# Patient Record
Sex: Male | Born: 1935 | Race: White | Hispanic: No | Marital: Married
Health system: Southern US, Community
[De-identification: ages and names within clinical notes are randomized; demographics above are authoritative.]

## PROBLEM LIST (undated history)

## (undated) DIAGNOSIS — M109 Gout, unspecified: Secondary | ICD-10-CM

## (undated) DIAGNOSIS — I639 Cerebral infarction, unspecified: Secondary | ICD-10-CM

---

## 2006-07-24 ENCOUNTER — Ambulatory Visit: Payer: Self-pay | Admitting: Gastroenterology

## 2009-09-15 ENCOUNTER — Ambulatory Visit: Payer: Self-pay | Admitting: Gastroenterology

## 2012-10-16 ENCOUNTER — Ambulatory Visit: Payer: Self-pay | Admitting: Gastroenterology

## 2012-10-17 LAB — PATHOLOGY REPORT

## 2013-12-11 DIAGNOSIS — E876 Hypokalemia: Secondary | ICD-10-CM | POA: Insufficient documentation

## 2013-12-11 DIAGNOSIS — I69351 Hemiplegia and hemiparesis following cerebral infarction affecting right dominant side: Secondary | ICD-10-CM | POA: Insufficient documentation

## 2013-12-11 DIAGNOSIS — I69322 Dysarthria following cerebral infarction: Secondary | ICD-10-CM | POA: Insufficient documentation

## 2014-01-05 DIAGNOSIS — I10 Essential (primary) hypertension: Secondary | ICD-10-CM | POA: Insufficient documentation

## 2014-04-08 DIAGNOSIS — K2270 Barrett's esophagus without dysplasia: Secondary | ICD-10-CM | POA: Insufficient documentation

## 2014-04-08 DIAGNOSIS — G629 Polyneuropathy, unspecified: Secondary | ICD-10-CM | POA: Insufficient documentation

## 2014-04-08 DIAGNOSIS — H6123 Impacted cerumen, bilateral: Secondary | ICD-10-CM | POA: Insufficient documentation

## 2014-05-27 DIAGNOSIS — N289 Disorder of kidney and ureter, unspecified: Secondary | ICD-10-CM | POA: Insufficient documentation

## 2014-05-27 DIAGNOSIS — R32 Unspecified urinary incontinence: Secondary | ICD-10-CM | POA: Insufficient documentation

## 2014-08-31 DIAGNOSIS — E782 Mixed hyperlipidemia: Secondary | ICD-10-CM | POA: Insufficient documentation

## 2014-08-31 DIAGNOSIS — R400 Somnolence: Secondary | ICD-10-CM | POA: Insufficient documentation

## 2014-12-02 DIAGNOSIS — R600 Localized edema: Secondary | ICD-10-CM | POA: Insufficient documentation

## 2014-12-02 DIAGNOSIS — R252 Cramp and spasm: Secondary | ICD-10-CM | POA: Insufficient documentation

## 2015-02-25 DIAGNOSIS — G8110 Spastic hemiplegia affecting unspecified side: Secondary | ICD-10-CM | POA: Insufficient documentation

## 2015-03-31 DIAGNOSIS — R0609 Other forms of dyspnea: Secondary | ICD-10-CM | POA: Insufficient documentation

## 2015-03-31 DIAGNOSIS — R5383 Other fatigue: Secondary | ICD-10-CM | POA: Insufficient documentation

## 2015-11-03 DIAGNOSIS — L8991 Pressure ulcer of unspecified site, stage 1: Secondary | ICD-10-CM | POA: Insufficient documentation

## 2016-01-07 DIAGNOSIS — R350 Frequency of micturition: Secondary | ICD-10-CM | POA: Insufficient documentation

## 2016-03-19 DIAGNOSIS — S72001A Fracture of unspecified part of neck of right femur, initial encounter for closed fracture: Secondary | ICD-10-CM | POA: Insufficient documentation

## 2019-01-16 ENCOUNTER — Other Ambulatory Visit: Payer: Self-pay

## 2019-01-16 ENCOUNTER — Non-Acute Institutional Stay: Payer: Medicare Other | Admitting: Primary Care

## 2019-01-16 DIAGNOSIS — Z515 Encounter for palliative care: Secondary | ICD-10-CM

## 2019-01-16 NOTE — Progress Notes (Signed)
Therap[facility], [facility] Consult Note Telephone: [phone]  Fax: [phone]  TELEHEALTH VISIT STATEMENT Due to the COVID-19 crisis, this visit was done via telemedicine from my office. It was initiated and consented to by this patient and/or family.  PATIENT NAME: Eugene [name] DOB: 1929-07-23 MRN: [id number]  PRIMARY CARE PROVIDER:   Drue FlirtHenry-Ksean [name], Carol, MD [address] [place] [address]  REFERRING PROVIDER: Drue FlirtHenry-Lestine [name], Carol, MD [address] [place] [address]  RESPONSIBLE PARTY:   Extended Emergency Contact Information Primary Emergency Contact: Eugene [name] Address: [address]          [place], [place] [address] Home Phone: [phone] Mobile Phone: [phone] Relation: Daughter Secondary Emergency Contact: Eugene [name] Mobile Phone: [phone] Relation: Daughter  Palliative Care was asked to follow this patient by consultation request of Eugene FlirtHenry-Raymir Frommelt, Carol, MD. This is the initial visit.  ASSESSMENT AND RECOMMENDATIONS:   1. Goals of Care: Maximize quality of life and symptom management.  2. Symptom Management:   Nutrition: Recommend med pass as family states he will take this readily. Also magic cup ice cream on his tray. Has stopped eating as much,  lost 10 lbs since April. Has had loss of appetite due to not being able to eat  In congregated area per his daughter.  He is a picky eater per family and hates [place]. Also had all teeth pulled and dentures created. He did  not eat with them but they also got thrown away by accident. Likes med pass but will not take nutrition supplement. Also likes ice cream.  Depression: Revisit anti depressant reduction. Is on anti depressant but was reduced from 50 mg to 25 mg in July. May be due to poor hearing, isolation. Continue SSRI and increase stimulation. Has done some window visits. Depressive outlook persists according to daughter. Sensory deficits have contributed  to depression per his daughter. He has good hearing aides but he also depends on reading lips. He is using a white board as well. I suggested TV ears so he can hear the television.  Immobility due to CVA and limb deficits: Has self-care deficits due to CVA and fractures from 2017. He did not make much progress after second insult of fractures. R sided hemiparesis, but is not weight bearing now due to debility. R side dominant and uses left hand for feeding now.   Dementia:  Family has wondered if this is an issue due to some confusion of reality with dreams (thought he heard fox hunting) and some hallucinations (reported a cross between a bat and spider on the ceiling to his family). Assess for increasing dementia of a vascular nature due to insult of CVA.   3. Family /Caregiver/Community Supports: T/c to Epic contact of record which was his wife, who is unable to answer phone. T/c to PR Eugene MustacheJudy Cobb, we discussed patient case and she gave me some insights, we also discussed his goals of care. 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  4. Cognitive / Functional decline: Staff states decline in intake and interaction. He has loss of function from previous CVA and debility. Daughter states he can ambulate in building with wheel chair.  5. Advance Care Directive: Full code per SNF record, POA discussed he has a living will. Daughter states they have prepared answere for MOST form. I will mail to them for them to return and post at [facility]. I will also upload DNR and MOST in [facility].  6. Follow up Palliative Care Visit: Palliative care will continue to follow for goals of care clarification and symptom management. Return 2-4 weeks or prn.  I spent 45 minutes providing this consultation,  from 1700 to 1745. More than 50% of the time in this consultation was spent coordinating communication.   HISTORY OF PRESENT ILLNESS:  Eugene [name] is a 85 y.o. year old male with multiple medical problems including  CVA, old hip R fracture, hearing deficit, spastic right hemiparesis, depression. Palliative Care was asked to help address goals of care.   CODE STATUS: Daughter gave verbal DNR, Will complete MOST form and return.  PPS: 30% HOSPICE ELIGIBILITY/DIAGNOSIS: TBD  PAST MEDICAL HISTORY: CVA, old hip R fracture, hearing deficit, spastic right hemiparesis, depression  SOCIAL HX:  Social History   Tobacco Use  . Smoking status: Not on file  Substance Use Topics  . Alcohol use: Not on file    ALLERGIES: Not on File   PERTINENT MEDICATIONS:  No outpatient encounter medications on file as of 83/11/2018.   No facility-administered encounter medications on file as of 83/11/2018.     PHYSICAL EXAM/ROS:   Current and past weights: 147 lb in April, now 137 lb. General: NAD, frail appearing, thin, very HOH, depends on lip reading. Cardiovascular: no chest pain reported, no edema,  Pulmonary: no cough, no increased SOB Abdomen: Appetite declining, eats 50%, endorses occ.  constipation, incontinent of bowel. Will take nutrition as med pass but won't take it as meal supplement.  GU: denies dysuria, incontinent of urine MSK: Right hemiparesis, loss of L LE strength and w/c bound Skin: no rashes or wounds reported Neurological: Weakness, R hemiparesis, mild cognitive impairment, reports of possible hallucinations  Cyndia Skeeters DNP AGPCNP-BC

## 2019-01-23 ENCOUNTER — Other Ambulatory Visit: Payer: Self-pay

## 2019-01-23 ENCOUNTER — Non-Acute Institutional Stay: Payer: Medicare Other | Admitting: Primary Care

## 2019-01-23 DIAGNOSIS — Z515 Encounter for palliative care: Secondary | ICD-10-CM

## 2019-01-23 NOTE — Progress Notes (Signed)
Therap[facility], [facility] Consult Note Telephone: [phone]  Fax: [phone]   PATIENT NAME: Eugene [name] Nolasco DOB: 18-Feb-1930 MRN: [id number]  PRIMARY CARE PROVIDER:   Drue FlirtHenry-Kionte Baumgardner, Carol, MD, [address] [place] [address] [phone]  REFERRING PROVIDER:  Drue FlirtHenry-Woodie Degraffenreid, Carol, MD [address] [place],  [place] [address] [phone]  RESPONSIBLE PARTY:   Extended Emergency Contact Information Primary Emergency Contact: Sherrie Mustacheobb, Judy Address: [address]          [place], [place] [address] Home Phone: [phone] Mobile Phone: [phone] Relation: Daughter Secondary Emergency Contact: Epifania GoreWoody, Carman Mobile Phone: [phone] Relation: Daughter   ASSESSMENT AND RECOMMENDATIONS:   1. Advance Care Planning/Goals of Care: Goals include to maximize quality of life and symptom management. I informed SW of my conversation with daughter last week giving verbal DNR. She'd also discussed MOST form. SW B Ammie FerrierDunkley will mail a MOST form to [place] to complete and return to [facility]. I will also scan that into Epic when returned. I visited today following initial visit last week in order to meet patient in person and continue needs assessment.  2. Symptom Management:   Nutrition: Poor intake. Add Medpass to regimen. Staff states they no longer offer nutritional supplements. I would encourage med pass if possible.   Dementia: Recommend ASAP ST assessment for communication needs. Recommend increase of SSRI back to effective level as demential and depression can overlap. Some reports of not understanding could be sensory related. He has periods that may seem like confusion but it could be due to his deafness.   3. Family /Caregiver/Community Supports: Has wife who is also under care, daughters  in area to care for parents. Current resident of LTC.  4. Cognitive / Functional decline: Alert,interactive. Is mobile in wheel chair, is up in w/c today.Very  HOH and cannot communicate effectively.   5. Follow up Palliative Care Visit: Palliative care will continue to follow for goals of care clarification and symptom management. Return 4 weeks or prn.  I spent 15 minutes providing this consultation,  from 1045 to 1100. More than 50% of the time in this consultation was spent coordinating communication.   HISTORY OF PRESENT ILLNESS:  Eugene [name] is a 85 y.o. year old male with multiple medical problems including CVA, old hip [name] fracture, hearing deficit, spastic right hemiparesis, depression. Palliative Care was asked to follow this patient by consultation request of Drue FlirtHenry-Sandhya Denherder, Carol, MD to help address advance care planning and goals of care. This is a follow up visit.  CODE STATUS: DNR  PPS: 30% HOSPICE ELIGIBILITY/DIAGNOSIS: TBD  PAST MEDICAL HISTORY: No past medical history on file.  SOCIAL HX:  Social History   Tobacco Use  . Smoking status: Not on file  Substance Use Topics  . Alcohol use: Not on file    ALLERGIES: Not on File   PERTINENT MEDICATIONS:  Outpatient Encounter Medications as of 01/23/2019  Medication Sig  . allopurinol (ZYLOPRIM) 300 MG tablet Take 150 mg by mouth daily.  Marland Kitchen. aspirin EC 81 MG tablet Take 81 mg by mouth daily.  . cholecalciferol (VITAMIN D3) 10 MCG (400 UNIT) TABS tablet Take 400 Units by mouth daily.  Marland Kitchen. HYDROcodone-acetaminophen (NORCO/VICODIN) 5-325 MG tablet Take 1 tablet by mouth every 6 (six) hours as needed for moderate pain.  . Melatonin 5 MG TABS Take 1 tablet by mouth at bedtime.  . methimazole (TAPAZOLE) 5 MG tablet Take 2.5 mg by mouth daily.  Marland Kitchen. omeprazole (  PRILOSEC) 20 MG capsule Take 20 mg by mouth daily.  . polyethylene glycol (MIRALAX / GLYCOLAX) 17 g packet Take 17 g by mouth daily.  . polyethylene glycol powder (GLYCOLAX/MIRALAX) 17 GM/SCOOP powder Take 1 Container by mouth once.  . sertraline (ZOLOFT) 25 MG tablet Take 25 mg by mouth daily.  . tamsulosin (FLOMAX) 0.4 MG CAPS  capsule Take 0.8 mg by mouth.   No facility-administered encounter medications on file as of 01/23/2019.     PHYSICAL EXAM / ROS:   Current and past weights: current 137 lb, 10 lb loss since April. General: NAD, frail appearing, thin HOH, depends on lip reading and context.  Cardiovascular: no chest pain reported, no edema,  Pulmonary: no cough, no increased SOB Abdomen: appetite fair (50% or less), endorses constipation, incontinent of bowel GU: denies dysuria, incontinent of urine MSK:  Right hemiparesis, loss of L LE strength and w/c bound Skin: no rashes or wounds reported Neurological: Weakness, very HOH, some mild cognitive impairment, [name] hemiparesis s/p CVA  Cyndia Skeeters DNP AGPCNP-BC  COVID-19 PATIENT SCREENING TOOL  Person answering questions: _____________staff______ _____   1.  Is the patient or any family member in the home showing any signs or symptoms regarding respiratory infection?               Person with Symptom- _________________na__________  a. Fever                                                                          Yes___ No___          ___________________  b. Shortness of breath                                                    Yes___ No___          ___________________ c. Cough/congestion                                       Yes___  No___         ___________________ d. Body aches/pains                                                         Yes___ No___        ____________________ e. Gastrointestinal symptoms (diarrhea, nausea)           Yes___ No___        ____________________  2. Within the past 14 days, has anyone living in the home had any contact with someone with or under investigation for COVID-19?    Yes___ No_x_   Person __________________

## 2019-02-20 ENCOUNTER — Non-Acute Institutional Stay: Payer: Medicare Other | Admitting: Primary Care

## 2019-02-20 ENCOUNTER — Other Ambulatory Visit: Payer: Self-pay

## 2019-02-20 DIAGNOSIS — Z515 Encounter for palliative care: Secondary | ICD-10-CM

## 2019-02-20 NOTE — Progress Notes (Signed)
Therapist, [facility] Consult Note Telephone: [phone]  Fax: [phone]  TELEHEALTH VISIT STATEMENT Due to the COVID-19 crisis, this visit was done via telemedicine from my office. It was initiated and consented to by this patient and/or family.  PATIENT NAME: Eugene Holsterhillip R Geibel DOB: 01/06/1930 MRN: [id number]  PRIMARY CARE PROVIDER:   Drue FlirtHenry-Alzena Gerber, Carol, MD, [address] [place] [address] [phone]  REFERRING PROVIDER:  Drue FlirtHenry-Noell Shular, Carol, MD [address] [place],  [place] [address] [phone]  RESPONSIBLE PARTY:   Extended Emergency Contact Information Primary Emergency Contact: Sherrie Mustacheobb, Judy Address: [address]          [place], [place] [address] Home Phone: [phone] Mobile Phone: [phone] Relation: Daughter Secondary Emergency Contact: Epifania GoreWoody, Carman Mobile Phone: [phone] Relation: Daughter   ASSESSMENT AND RECOMMENDATIONS:   1. Advance Care Planning/Goals of Care: Goals include to maximize quality of life and symptom management.DNR on record. Was reported to have MOST but I cannot locate it. I will reach out to family to create MOST.  2. Symptom Management:   Dysphagia: ST is working with patient on communications, dysphagia. On nectar liquids.  Nutrition: Recommend supplements, eg med pass. Not on med list. Weights still vary, could benefit from supplements as he is still under recommended weight.   Pain: Denies pain, appears comfortable. States he sleeps well.   3. Family /Caregiver/Community Supports: Has wife who is in need of ATC care, daughters  in area to care for parents. Current resident of LTC.  4. Cognitive / Functional decline:  Mild cognitive decline but also has extreme sensory deprivation, cannot hear must communicate by answering questions on written white board. We looked at the wedding picture of his daughter some time ago.  5. Follow up Palliative Care Visit: Palliative care will  continue to follow for goals of care clarification and symptom management. Return 4 weeks or prn.  I spent 25 minutes providing this consultation,  from 1030 to 1055. More than 50% of the time in this consultation was spent coordinating communication.   HISTORY OF PRESENT ILLNESS:  Eugene [name] is a 85 y.o. year old male with multiple medical problems including CVA, old hip R fracture, hearing deficit, spastic right hemiparesis, depression. Palliative Care was asked to follow this patient by consultation request of Drue FlirtHenry-Cattaleya Wien, Carol, MD to help address advance care planning and goals of care. This is a follow up visit.  CODE STATUS: DNR, no MOST on chart  PPS: 40% HOSPICE ELIGIBILITY/DIAGNOSIS: TBD  PAST MEDICAL HISTORY: CVA, old hip R fracture, hearing deficit, spastic right hemiparesis, depression. SOCIAL HX:  Social History   Tobacco Use  . Smoking status: Not on file  Substance Use Topics  . Alcohol use: Not on file    ALLERGIES: Not on File   PERTINENT MEDICATIONS:  Outpatient Encounter Medications as of 02/20/2019  Medication Sig  . allopurinol (ZYLOPRIM) 300 MG tablet Take 150 mg by mouth daily.  Marland Kitchen. aspirin EC 81 MG tablet Take 81 mg by mouth daily.  . cholecalciferol (VITAMIN D3) 10 MCG (400 UNIT) TABS tablet Take 400 Units by mouth daily.  . Melatonin 5 MG TABS Take 1 tablet by mouth at bedtime.  . methimazole (TAPAZOLE) 5 MG tablet Take 2.5 mg by mouth daily.  Marland Kitchen. omeprazole (PRILOSEC) 20 MG capsule Take 20 mg by mouth daily.  . polyethylene glycol (MIRALAX / GLYCOLAX) 17 g packet Take 17 g by mouth daily.  . sertraline (ZOLOFT) 25  MG tablet Take 25 mg by mouth daily.  . tamsulosin (FLOMAX) 0.4 MG CAPS capsule Take 0.8 mg by mouth.  Marland Kitchen HYDROcodone-acetaminophen (NORCO/VICODIN) 5-325 MG tablet Take 1 tablet by mouth every 6 (six) hours as needed for moderate pain.  . polyethylene glycol powder (GLYCOLAX/MIRALAX) 17 GM/SCOOP powder Take 1 Container by mouth once.   No  facility-administered encounter medications on file as of 02/20/2019.     PHYSICAL EXAM / ROS:   Current and past weights: 140 lbs, has gained 9 lbs since 7/20. Need to verify some of these readings as noted 10 lb loss since April. General: NAD, frail appearing, thin Cardiovascular: no chest pain reported, no edema noted Pulmonary: no cough, no increased SOB, room air Abdomen: appetite fair, eats 50%, denies constipation, incontinent of bowel GU: denies dysuria, incontinent of urine MSK:  hemiparesis, loss of L LE strength and w/c bound in w/c this am Skin: no rashes or wounds reported Neurological: Weakness, mild impairment, Very HOH, uses communication board, states good sleep, no pain  Cyndia Skeeters DNP AGPCNP-BC  COVID-19 PATIENT SCREENING TOOL  Person answering questions: ______________Staff Julia_____ _____   1.  Is the patient or any family member in the home showing any signs or symptoms regarding respiratory infection?               Person with Symptom- _________________nA__________  a. Fever                                                                          Yes___ No___          ___________________  b. Shortness of breath                                                    Yes___ No___          ___________________ c. Cough/congestion                                       Yes___  No___         ___________________ d. Body aches/pains                                                         Yes___ No___        ____________________ e. Gastrointestinal symptoms (diarrhea, nausea)           Yes___ No___        ____________________  2. Within the past 14 days, has anyone living in the home had any contact with someone with or under investigation for COVID-19?    Yes___ No_x_   Person __________________

## 2019-02-21 ENCOUNTER — Telehealth: Payer: Self-pay | Admitting: Primary Care

## 2019-02-21 NOTE — Telephone Encounter (Signed)
T/c to daughter Bethena Roys, no answer, message left to return call.

## 2019-03-20 ENCOUNTER — Non-Acute Institutional Stay: Payer: Medicare Other | Admitting: Primary Care

## 2019-03-21 ENCOUNTER — Other Ambulatory Visit: Payer: Self-pay

## 2019-03-21 ENCOUNTER — Non-Acute Institutional Stay: Payer: Medicare Other | Admitting: Primary Care

## 2019-03-21 DIAGNOSIS — Z515 Encounter for palliative care: Secondary | ICD-10-CM

## 2019-03-21 NOTE — Progress Notes (Signed)
[facility] Pall[facility] Consult Note Telephone: [phone]  Fax: [phone]  TELEHEALTH VISIT STATEMENT Due to the COVID-19 crisis, this visit was done via telemedicine from my office. It was initiated and consented to by this patient and/or family.  PATIENT NAME: Eugene [name] [place] [address] [phone] (home)  DOB: 03/06/1930 MRN: [id number]  PRIMARY CARE PROVIDER:   Marisa Hua, MD, [place] [address] [phone]  REFERRING PROVIDER:  Marisa Hua, MD [address] [place],  [place] [address] [phone]  RESPONSIBLE PARTY:   Extended Emergency Contact Information Primary Emergency Contact: Darnelle Maffucci Address: [address]          [place], [place] [address] Home Phone: [phone] Mobile Phone: [phone] Relation: Daughter Secondary Emergency Contact: Alona Bene Mobile Phone: [phone] Relation: Daughter   ASSESSMENT AND RECOMMENDATIONS:   1. Advance Care Planning/Goals of Care: Goals include to maximize quality of life and symptom management. DNR on file at SNF. Will mail MOST to family for upload to [facility].  2. Symptom Management:    Dysphagia: ST has finished working with patient and cleared for thin liquids. Was on nectar thick a month ago.  Nutrition: Eating 50% but BMI is WNL. Recommend supplement if wt continues to decline, although it appears variable over the past months.  Depression: Staff reports patient is doing well and has a fair mood. Recommend review of sertraline to 50 mg per guidelines to address depression.  3. Family /Caregiver/Community Supports: Family in area,visit frequently at window.  4. Cognitive / Functional decline: Mild decline, exacerbated by loss of hearing. Able to roll in w/c,iteracts with family at window visits. Staff assists with hearing aids, and uses white board for communication.  5. Follow up Palliative Care  Visit: Palliative care will continue to follow for goals of care clarification and symptom management. Return 4-6 weeks or prn.  I spent 25 minutes providing this consultation,  from 1000 to 1025. More than 50% of the time in this consultation was spent coordinating communication.   HISTORY OF PRESENT ILLNESS:  Eugene [name] is a 85 y.o. year old male with multiple medical problems including CVA, old hip R fracture, hearing deficit, spastic right hemiparesis, depression. Palliative Care was asked to follow this patient by consultation request of Marisa Hua, MD to help address advance care planning and goals of care. This is a follow up visit.  CODE STATUS: DNR  PPS: 30% HOSPICE ELIGIBILITY/DIAGNOSIS: TBD  PAST MEDICAL HISTORY: CVA, old hip R fracture, hearing deficit, spastic right hemiparesis, depression.  SOCIAL HX:  Social History   Tobacco Use  . Smoking status: Not on file  Substance Use Topics  . Alcohol use: Not on file    ALLERGIES: Not on File   PERTINENT MEDICATIONS:  Outpatient Encounter Medications as of 03/21/2019  Medication Sig  . allopurinol (ZYLOPRIM) 300 MG tablet Take 150 mg by mouth daily.  Marland Kitchen aspirin EC 81 MG tablet Take 81 mg by mouth daily.  . cholecalciferol (VITAMIN D3) 10 MCG (400 UNIT) TABS tablet Take 400 Units by mouth daily.  . Melatonin 5 MG TABS Take 1 tablet by mouth at bedtime.  . methimazole (TAPAZOLE) 5 MG tablet Take 2.5 mg by mouth daily.  Marland Kitchen omeprazole (PRILOSEC) 20 MG capsule Take 20 mg by mouth daily.  . polyethylene glycol (MIRALAX / GLYCOLAX) 17 g packet Take 17 g by mouth daily.  . sertraline (ZOLOFT) 25  MG tablet Take 25 mg by mouth daily.  . tamsulosin (FLOMAX) 0.4 MG CAPS capsule Take 0.8 mg by mouth.  Marland Kitchen HYDROcodone-acetaminophen (NORCO/VICODIN) 5-325 MG tablet Take 1 tablet by mouth every 6 (six) hours as needed for moderate pain.  . polyethylene glycol powder (GLYCOLAX/MIRALAX) 17 GM/SCOOP powder Take 1 Container by mouth  once.   No facility-administered encounter medications on file as of 03/21/2019.     PHYSICAL EXAM / ROS:  97.5-78-18 130/60  Average VS from flow sheet Current and past weights: 139.5 lbs, past weights 9/20=140 lbs, has gained 9 lbs since 7/20.  General: NAD, frail, thin Cardiovascular: no chest pain reported, no edema reported Pulmonary: no cough, no increased SOB Abdomen: appetite fair, 50%, denies  constipation, incontinent of bowel GU: denies dysuria, incontinent of urine MSK:  no joint deformities, non ambulatory, uses wheel chair, no gout flares reported Skin: no rashes or wounds reported Neurological: Weakness, Deafness, depression  Eliezer Lofts, NP

## 2019-04-25 ENCOUNTER — Non-Acute Institutional Stay: Payer: Medicare Other | Admitting: Primary Care

## 2019-04-25 ENCOUNTER — Other Ambulatory Visit: Payer: Self-pay

## 2019-04-25 DIAGNOSIS — Z515 Encounter for palliative care: Secondary | ICD-10-CM

## 2019-04-25 NOTE — Progress Notes (Signed)
Therapist, nutritional Palliative Care Consult Note Telephone: [phone]  Fax: [phone]  TELEHEALTH VISIT STATEMENT Due to the COVID-19 crisis, this visit was done via telemedicine from my office. It was initiated and consented to by this patient and/or family.  PATIENT NAME: Eugene [name] [address] [place] [address] [phone] (home)  DOB: [birth year] MRN: [id number]  PRIMARY CARE PROVIDER:   Drue Flirt, MD, [address] [place] [address] [phone]  REFERRING PROVIDER:  Drue Flirt, MD [address] [place],  [place] [address] [phone]  RESPONSIBLE PARTY:   Extended Emergency Contact Information Primary Emergency Contact: Sherrie Mustache Address: [address]          [place], [place] [address] Home Phone: [phone] Mobile Phone: [phone] Relation: Daughter Secondary Emergency Contact: Epifania Gore Mobile Phone: [phone] Relation: Daughter   ASSESSMENT AND RECOMMENDATIONS:   1. Advance Care Planning/Goals of Care: Goals include to maximize quality of life and symptom management.DNR on file, daughter Darel Hong is POA, called to discuss her questions/concerns. She has been very happy with being able to use her I phone to talk with her dad with blue tooth to his hearing aids.  2. Symptom Management:   Pain: Seems well controlled, patient is relaxed and smiling.  Constipation:  Recommend adding senna 8.6 mg daily or bid. Appears to have some  constipation with stools at 3 days, should have 3 soft stools a week without extreme effort to move bowels.   Nutrition: Continue to offer supplements, help with set up and feeding/cueing if needed. Weight is staying stable. Thin liquids, aspiration precautions.  Communication: Has dry erase markers and board for communication. Recommend a communication board pre -made with pictures for pointing out needs. Also recommend leaving out the white board so staff can  use it readily for communications.  3. Family /Caregiver/Community Supports: Family in area, daughter is POA and very involved in care. Patient is long term resident of facility.  4. Cognitive / Functional decline: Alert, oriented x 2-3. Stable with physical function at this time. Needs help with most adls, all iadls.   5. Follow up Palliative Care Visit: Palliative care will continue to follow for goals of care clarification and symptom management. Return 4-6 weeks or prn.  I spent 35 minutes providing this consultation,  from 1100 to 1135. More than 50% of the time in this consultation was spent coordinating communication.   HISTORY OF PRESENT ILLNESS:  MARCQUES [name] is a 85 y.o. year old male with multiple medical problems including CVA, old hip R fracture, hearing deficit, spastic right hemiparesis, depression. Palliative Care was asked to follow this patient by consultation request of Drue Flirt, MD to help address advance care planning and goals of care. This is a follow up visit.   CODE STATUS: DNR, family has not returned MOST as of yet.  PPS: 30% HOSPICE ELIGIBILITY/DIAGNOSIS: TBD  PAST MEDICAL HISTORY:CVA, old hip R fracture, hearing deficit, spastic right hemiparesis, depression.  SOCIAL HX:  Social History   Tobacco Use  . Smoking status: Not on file  Substance Use Topics  . Alcohol use: Not on file    ALLERGIES: Not on File   PERTINENT MEDICATIONS:  Outpatient Encounter Medications as of 04/25/2019  Medication Sig  . allopurinol (ZYLOPRIM) 300 MG tablet Take 150 mg by mouth daily.  Marland Kitchen aspirin EC 81 MG tablet Take 81 mg by mouth daily.  . cholecalciferol (VITAMIN D3) 10 MCG (400  UNIT) TABS tablet Take 400 Units by mouth daily.  Marland Kitchen HYDROcodone-acetaminophen (NORCO/VICODIN) 5-325 MG tablet Take 1 tablet by mouth every 6 (six) hours as needed for moderate pain.  . Melatonin 5 MG TABS Take 1 tablet by mouth at bedtime.  . methimazole (TAPAZOLE) 5 MG tablet Take  2.5 mg by mouth daily.  Marland Kitchen omeprazole (PRILOSEC) 20 MG capsule Take 20 mg by mouth daily.  . polyethylene glycol (MIRALAX / GLYCOLAX) 17 g packet Take 17 g by mouth daily.  . polyethylene glycol powder (GLYCOLAX/MIRALAX) 17 GM/SCOOP powder Take 1 Container by mouth once.  . sertraline (ZOLOFT) 25 MG tablet Take 25 mg by mouth daily.  . tamsulosin (FLOMAX) 0.4 MG CAPS capsule Take 0.8 mg by mouth.   No facility-administered encounter medications on file as of 04/25/2019.     PHYSICAL EXAM / ROS:   Current and past weights: 140 lbs., Oct 2020 =139.5 lbs, past weights 9/20=140 lbs, has gained 9 lbs since 7/20.  General: NAD, frail appearing, thin Cardiovascular: no chest pain reported, no edema, with compression stockings Pulmonary: no cough, no increased SOB, sat 97% Room air Abdomen: appetite fair to good,50%- 75%, endorses constipation, incontinent of bowel GU: denies dysuria, incontinent of urine MSK:  no joint deformities, non - ambulatory, in a wheel chair. Skin: staff reports red scratches on chest Neurological: Weakness,deafness, communicates with writing board.  Jason Coop, NP

## 2019-06-11 ENCOUNTER — Non-Acute Institutional Stay: Payer: Medicare Other | Admitting: Primary Care

## 2019-06-12 ENCOUNTER — Non-Acute Institutional Stay: Payer: Medicare Other | Admitting: Primary Care

## 2019-06-12 ENCOUNTER — Other Ambulatory Visit: Payer: Self-pay

## 2019-06-12 DIAGNOSIS — Z515 Encounter for palliative care: Secondary | ICD-10-CM

## 2019-06-12 NOTE — Progress Notes (Signed)
Therapist, nutritional Palliative Care Consult Note Telephone: [phone]  Fax: [phone]  TELEHEALTH VISIT STATEMENT Due to the COVID-19 crisis, this visit was done via telemedicine from my office. It was initiated and consented to by this patient and/or family.  PATIENT NAME: Eugene [name] [address] [place] [address] [phone] (home)  DOB: Sep 11, 1929 MRN: [id number]  PRIMARY CARE PROVIDER:   Drue Flirt, MD, [address] [place] [address] [phone]  REFERRING PROVIDER:  Drue Flirt, MD [address] [place],  [place] [address] [phone]  RESPONSIBLE PARTY:   Extended Emergency Contact Information Primary Emergency Contact: Sherrie Mustache Address: [address]          [place], [place] [address] Home Phone: [phone] Mobile Phone: [phone] Relation: Daughter Secondary Emergency Contact: Epifania Gore Mobile Phone: [phone] Relation: Daughter   ASSESSMENT AND RECOMMENDATIONS:   1. Advance Care Planning/Goals of Care: Goals include to maximize quality of life and symptom management. Daughter is POA and visits patient often.  2. Symptom Management:   I saw Eugene [name] today;  he was quiet and non-interactive. He was alert and sitting in a wheelchair. Zoom was conducted by hospice nurse on the premises. Eugene [name] did not  make eye contact even when the nurse wrote " good morning "and happy new year on his whiteboard. Patient appears to be without distress but was not interactive with people he did not know well. He appears well nourished and well developed, and  well groomed. I will continue to follow for palliative assessment.  3. Family /Caregiver/Community Supports: Family in area, wife lives with daughter. Patient lives in LTC .  4. Cognitive / Functional decline: Alert but not verbal today. Flat affect. Able to do minimal adls without help.   5. Follow up Palliative Care Visit:  Palliative care will continue to follow for goals of care clarification and symptom management. Return 8 weeks or prn.  I spent 25 minutes providing this consultation,  from 1130 to 1155. More than 50% of the time in this consultation was spent coordinating communication.   HISTORY OF PRESENT ILLNESS:  Eugene [name] is a 85 y.o. year old male with multiple medical problems including CVA, old hip R fracture, hearing deficit, spastic right hemiparesis, depression. Palliative Care was asked to follow this patient by consultation request of Drue Flirt, MD to help address advance care planning and goals of care. This is a follow up visit.  CODE STATUS: DNR  PPS: 30% HOSPICE ELIGIBILITY/DIAGNOSIS: TBD  PAST MEDICAL HISTORY: CVA, old hip R fracture, hearing deficit, spastic right hemiparesis, depression. SOCIAL HX:  Social History   Tobacco Use  . Smoking status: Not on file  Substance Use Topics  . Alcohol use: Not on file    ALLERGIES: Not on File   PERTINENT MEDICATIONS:  Outpatient Encounter Medications as of 06/12/2019  Medication Sig  . allopurinol (ZYLOPRIM) 300 MG tablet Take 150 mg by mouth daily.  Marland Kitchen aspirin EC 81 MG tablet Take 81 mg by mouth daily.  . cholecalciferol (VITAMIN D3) 10 MCG (400 UNIT) TABS tablet Take 400 Units by mouth daily.  Marland Kitchen HYDROcodone-acetaminophen (NORCO/VICODIN) 5-325 MG tablet Take 1 tablet by mouth every 6 (six) hours as needed for moderate pain.  . Melatonin 5 MG TABS Take 1 tablet by mouth at bedtime.  . methimazole (TAPAZOLE) 5 MG tablet Take 2.5 mg by mouth daily.  Marland Kitchen omeprazole (PRILOSEC) 20 MG capsule Take  20 mg by mouth daily.  . polyethylene glycol powder (GLYCOLAX/MIRALAX) 17 GM/SCOOP powder Take 1 Container by mouth once.  . sertraline (ZOLOFT) 25 MG tablet Take 25 mg by mouth daily.  . tamsulosin (FLOMAX) 0.4 MG CAPS capsule Take 0.8 mg by mouth.   No facility-administered encounter medications on file as of 06/12/2019.     PHYSICAL EXAM / ROS:   Current and past weights: 140 lbs, stable for patient General: NAD, frail appearing, thin Cardiovascular: no chest pain reported, no edema Pulmonary: no cough, no increased SOB, Room air Abdomen: appetite fair, intake 50%, denies constipation, incontinent of bowel GU: denies dysuria, incontinent of urine MSK:  no joint deformities,non  Ambulatory, sits in w/c most of the day at the window Skin: no rashes or wounds reported Neurological: Weakness, very deaf/HOH, non interactive today/flat affect  Jason Coop, NP [facility]

## 2019-07-08 ENCOUNTER — Emergency Department: Payer: Medicare Other

## 2019-07-08 ENCOUNTER — Other Ambulatory Visit: Payer: Self-pay

## 2019-07-08 ENCOUNTER — Emergency Department
Admission: EM | Admit: 2019-07-08 | Discharge: 2019-07-08 | Disposition: A | Discharge status: Home / Self Care | Payer: Medicare Other | Attending: Emergency Medicine | Admitting: Emergency Medicine

## 2019-07-08 DIAGNOSIS — S0101XA Laceration without foreign body of scalp, initial encounter: Secondary | ICD-10-CM | POA: Diagnosis not present

## 2019-07-08 DIAGNOSIS — Y9389 Activity, other specified: Secondary | ICD-10-CM | POA: Diagnosis not present

## 2019-07-08 DIAGNOSIS — Y92129 Unspecified place in nursing home as the place of occurrence of the external cause: Secondary | ICD-10-CM | POA: Insufficient documentation

## 2019-07-08 DIAGNOSIS — R634 Abnormal weight loss: Secondary | ICD-10-CM | POA: Diagnosis not present

## 2019-07-08 DIAGNOSIS — D649 Anemia, unspecified: Secondary | ICD-10-CM

## 2019-07-08 DIAGNOSIS — Y998 Other external cause status: Secondary | ICD-10-CM | POA: Diagnosis not present

## 2019-07-08 DIAGNOSIS — Z7982 Long term (current) use of aspirin: Secondary | ICD-10-CM | POA: Insufficient documentation

## 2019-07-08 DIAGNOSIS — Z79899 Other long term (current) drug therapy: Secondary | ICD-10-CM | POA: Insufficient documentation

## 2019-07-08 DIAGNOSIS — W19XXXA Unspecified fall, initial encounter: Secondary | ICD-10-CM | POA: Diagnosis not present

## 2019-07-08 DIAGNOSIS — S0990XA Unspecified injury of head, initial encounter: Secondary | ICD-10-CM | POA: Diagnosis present

## 2019-07-08 HISTORY — DX: Gout, unspecified: M10.9

## 2019-07-08 HISTORY — DX: Cerebral infarction, unspecified: I63.9

## 2019-07-08 LAB — URINALYSIS, COMPLETE (UACMP) WITH MICROSCOPIC
Bacteria, UA: NONE SEEN
Bilirubin Urine: NEGATIVE
Glucose, UA: NEGATIVE mg/dL
Ketones, ur: NEGATIVE mg/dL
Leukocytes,Ua: NEGATIVE
Nitrite: NEGATIVE
Protein, ur: NEGATIVE mg/dL
Specific Gravity, Urine: 1.019 (ref 1.005–1.030)
Squamous Epithelial / HPF: NONE SEEN (ref 0–5)
pH: 5 (ref 5.0–8.0)

## 2019-07-08 LAB — CBC WITH DIFFERENTIAL/PLATELET
Abs Immature Granulocytes: 0.05 10*3/uL (ref 0.00–0.07)
Basophils Absolute: 0.1 10*3/uL (ref 0.0–0.1)
Basophils Relative: 1 %
Eosinophils Absolute: 0.3 10*3/uL (ref 0.0–0.5)
Eosinophils Relative: 3 %
HCT: 37.5 % — ABNORMAL LOW (ref 39.0–52.0)
Hemoglobin: 12.1 g/dL — ABNORMAL LOW (ref 13.0–17.0)
Immature Granulocytes: 1 %
Lymphocytes Relative: 9 %
Lymphs Abs: 0.8 10*3/uL (ref 0.7–4.0)
MCH: 29.7 pg (ref 26.0–34.0)
MCHC: 32.3 g/dL (ref 30.0–36.0)
MCV: 91.9 fL (ref 80.0–100.0)
Monocytes Absolute: 0.7 10*3/uL (ref 0.1–1.0)
Monocytes Relative: 8 %
Neutro Abs: 6.8 10*3/uL (ref 1.7–7.7)
Neutrophils Relative %: 78 %
Platelets: 119 10*3/uL — ABNORMAL LOW (ref 150–400)
RBC: 4.08 MIL/uL — ABNORMAL LOW (ref 4.22–5.81)
RDW: 14.2 % (ref 11.5–15.5)
WBC: 8.6 10*3/uL (ref 4.0–10.5)
nRBC: 0 % (ref 0.0–0.2)

## 2019-07-08 LAB — BASIC METABOLIC PANEL
Anion gap: 8 (ref 5–15)
BUN: 27 mg/dL — ABNORMAL HIGH (ref 8–23)
CO2: 22 mmol/L (ref 22–32)
Calcium: 8.8 mg/dL — ABNORMAL LOW (ref 8.9–10.3)
Chloride: 110 mmol/L (ref 98–111)
Creatinine, Ser: 1.13 mg/dL (ref 0.61–1.24)
GFR calc Af Amer: >60 mL/min (ref >60)
GFR calc non Af Amer: 58 mL/min — ABNORMAL LOW (ref >60)
Glucose, Bld: 91 mg/dL (ref 70–99)
Potassium: 4 mmol/L (ref 3.5–5.1)
Sodium: 140 mmol/L (ref 135–145)

## 2019-07-08 LAB — TROPONIN I (HIGH SENSITIVITY): Troponin I (High Sensitivity): 8 ng/L (ref <18)

## 2019-07-08 MED ORDER — LIDOCAINE HCL (PF) 1 % IJ SOLN
5.0000 mL | Freq: Once | INTRAMUSCULAR | Status: AC
  Administered 2019-07-08: 11:00:00 5 mL
  Filled 2019-07-08: qty 5

## 2019-07-08 NOTE — ED Notes (Signed)
ED secretary made aware patient needs EMS transportation back to [facility]

## 2019-07-08 NOTE — ED Notes (Signed)
Patient given icecream and applesauce

## 2019-07-08 NOTE — ED Notes (Signed)
Pt cleaned up and repositioned in bed. Pt placed in clean brief. Dirty linens changed. Daughter at bedside.

## 2019-07-08 NOTE — ED Notes (Signed)
MD Goodman at bedside. 

## 2019-07-08 NOTE — ED Triage Notes (Signed)
Pt comes via [facility] from [facility] with c/o unwitnessed fall. EMS reports pt was found supine on the floor.  EMS states facility believes pt slipped out of his chair. VSS  EMS states pt has hematoma note to back of head. Gauze wrapped around head, pt in c-collar.  Pt is HOH but alert. Pt denies any pain but states his feelings is hurt.

## 2019-07-08 NOTE — ED Notes (Signed)
Daughter at bedside with pt.  

## 2019-07-08 NOTE — ED Provider Notes (Signed)
[facility] Emergency Department Provider Note  ____________________________________________   I have reviewed the triage vital signs and the nursing notes.   HISTORY  Chief Complaint Fall   History limited by: Difficulty hearing, some history obtained from daughter.    HPI Eugene [name] is a 86 y.o. male who presents to the emergency department today after suffering an unwitnessed fall. The patient is coming from a nursing facility. The patient was found on the ground. Suffered a laceration in the back of the scalp. Daughter states that the patient has seemed slightly weaker. Does have history of some weight loss over the past few months. Is being followed by palliative care.   Records reviewed. Per medical record review patient has a history of cerebral infarct.  Past Medical History:  Diagnosis Date  . Cerebral infarct (HCC)   . Gout     There are no problems to display for this patient.   History reviewed. No pertinent surgical history.  Prior to Admission medications   Medication Sig Start Date End Date Taking? Authorizing Provider  allopurinol (ZYLOPRIM) 300 MG tablet Take 150 mg by mouth daily.   Yes [provider]  aspirin EC 81 MG tablet Take 81 mg by mouth daily.   Yes [provider]  cholecalciferol (VITAMIN D3) 10 MCG (400 UNIT) TABS tablet Take 400 Units by mouth daily.   Yes [provider]  Melatonin 5 MG TABS Take 5 mg by mouth at bedtime.    Yes [provider]  methimazole (TAPAZOLE) 5 MG tablet Take 2.5 mg by mouth daily.   Yes [provider]  omeprazole (PRILOSEC) 20 MG capsule Take 20 mg by mouth daily.   Yes [provider]  polyethylene glycol powder (GLYCOLAX/MIRALAX) 17 GM/SCOOP powder Take 1 Container by mouth once.   Yes [provider]  sertraline (ZOLOFT) 25 MG tablet Take 25 mg by mouth daily.   Yes [provider]  tamsulosin (FLOMAX) 0.4 MG  CAPS capsule Take 0.8 mg by mouth.   Yes [provider]    Allergies Patient has no known allergies.  No family history on file.  Social History Social History   Tobacco Use  . Smoking status: Not on file  Substance Use Topics  . Alcohol use: Not on file  . Drug use: Not on file   Review of Systems Unable to obtain secondary to difficulty with conversation ____________________________________________   PHYSICAL EXAM:  VITAL SIGNS: ED Triage Vitals  Enc Vitals Group     BP 07/08/19 0947 (!) 145/56     Pulse Rate 07/08/19 0947 62     Resp 07/08/19 0947 18     Temp 07/08/19 0947 97.9 F (36.6 C)     Temp Source 07/08/19 0947 Oral     SpO2 07/08/19 0947 98 %     Weight 07/08/19 0927 140 lb (63.5 kg)     Height 07/08/19 0927 5\' 4"  (1.626 m)     Head Circumference --      Peak Flow --      Pain Score 07/08/19 0927 0   Constitutional: Alert and oriented.  Eyes: Conjunctivae are normal.  ENT      Head: Normocephalic.      Nose: No congestion/rhinnorhea.      Mouth/Throat: Mucous membranes are moist.      Neck: No stridor. Hematological/Lymphatic/Immunilogical: No cervical lymphadenopathy. Cardiovascular: Normal rate, regular rhythm.  No murmurs, rubs, or gallops. Respiratory: Normal respiratory effort without  tachypnea nor retractions. Breath sounds are clear and equal bilaterally. No wheezes/rales/rhonchi. Gastrointestinal: Soft and non tender. No rebound. No guarding.  Genitourinary: Deferred Musculoskeletal: Normal range of motion in all extremities. No lower extremity edema. Neurologic:  Awake and alert. Not able to hear easily questions.  Skin:  Skin is warm. Roughly 1.5 cm laceration to occiput ____________________________________________    LABS (pertinent positives/negatives)  Trop hs 8 BMP wnl except bun 27, ca 8.8 CBC wbc 8.6, hgb 12.1, plt 119 UA clear, small hgb dipstick, 0-5 rbc and  wbc  ____________________________________________   EKG  None  ____________________________________________    RADIOLOGY  CT head/cervical spine No acute traumatic findings  ____________________________________________   PROCEDURES  Procedures  ____________________________________________   INITIAL IMPRESSION / ASSESSMENT AND PLAN / ED COURSE  Pertinent labs & imaging results that were available during my care of the patient were reviewed by me and considered in my medical decision making (see chart for details).   Patient presented to the emergency department today after an unwitnessed fall. On exam patient did have a 1.5 cm laceration to the occiput. PA student did close the wound with 3 staples. Blood work and urine without concerning findings. Will plan on discharging back to facility.  ___________________________________________   FINAL CLINICAL IMPRESSION(S) / ED DIAGNOSES  Final diagnoses:  Fall, initial encounter  Laceration of scalp, initial encounter  Anemia, unspecified type     Note: This dictation was prepared with Dragon dictation. Any transcriptional errors that result from this process are unintentional     Nance Pear, MD 07/08/19 1151

## 2019-07-08 NOTE — Discharge Instructions (Addendum)
The staples should be removed in 7-10 days. Please seek medical attention for any high fevers, chest pain, shortness of breath, change in behavior, persistent vomiting, bloody stool or any other new or concerning symptoms.

## 2019-07-20 ENCOUNTER — Encounter: Payer: Self-pay | Admitting: Emergency Medicine

## 2019-07-20 ENCOUNTER — Emergency Department: Payer: Medicare Other

## 2019-07-20 ENCOUNTER — Other Ambulatory Visit: Payer: Self-pay

## 2019-07-20 ENCOUNTER — Emergency Department
Admission: EM | Admit: 2019-07-20 | Discharge: 2019-07-21 | Disposition: A | Discharge status: Skilled Nursing Facility | Payer: Medicare Other | Attending: Emergency Medicine | Admitting: Emergency Medicine

## 2019-07-20 DIAGNOSIS — E860 Dehydration: Secondary | ICD-10-CM | POA: Insufficient documentation

## 2019-07-20 DIAGNOSIS — N390 Urinary tract infection, site not specified: Secondary | ICD-10-CM | POA: Diagnosis not present

## 2019-07-20 DIAGNOSIS — R4182 Altered mental status, unspecified: Secondary | ICD-10-CM | POA: Insufficient documentation

## 2019-07-20 DIAGNOSIS — Z8673 Personal history of transient ischemic attack (TIA), and cerebral infarction without residual deficits: Secondary | ICD-10-CM | POA: Diagnosis not present

## 2019-07-20 DIAGNOSIS — R627 Adult failure to thrive: Secondary | ICD-10-CM | POA: Diagnosis present

## 2019-07-20 DIAGNOSIS — R531 Weakness: Secondary | ICD-10-CM

## 2019-07-20 LAB — CBC WITH DIFFERENTIAL/PLATELET
Abs Immature Granulocytes: 0.05 10*3/uL (ref 0.00–0.07)
Basophils Absolute: 0.1 10*3/uL (ref 0.0–0.1)
Basophils Relative: 1 %
Eosinophils Absolute: 0.2 10*3/uL (ref 0.0–0.5)
Eosinophils Relative: 3 %
HCT: 38.4 % — ABNORMAL LOW (ref 39.0–52.0)
Hemoglobin: 12.2 g/dL — ABNORMAL LOW (ref 13.0–17.0)
Immature Granulocytes: 1 %
Lymphocytes Relative: 16 %
Lymphs Abs: 1.3 10*3/uL (ref 0.7–4.0)
MCH: 29.5 pg (ref 26.0–34.0)
MCHC: 31.8 g/dL (ref 30.0–36.0)
MCV: 92.8 fL (ref 80.0–100.0)
Monocytes Absolute: 0.5 10*3/uL (ref 0.1–1.0)
Monocytes Relative: 7 %
Neutro Abs: 6 10*3/uL (ref 1.7–7.7)
Neutrophils Relative %: 72 %
Platelets: 151 10*3/uL (ref 150–400)
RBC: 4.14 MIL/uL — ABNORMAL LOW (ref 4.22–5.81)
RDW: 14.5 % (ref 11.5–15.5)
WBC: 8.1 10*3/uL (ref 4.0–10.5)
nRBC: 0 % (ref 0.0–0.2)

## 2019-07-20 LAB — URINALYSIS, COMPLETE (UACMP) WITH MICROSCOPIC
Bacteria, UA: NONE SEEN
Bilirubin Urine: NEGATIVE
Glucose, UA: NEGATIVE mg/dL
Ketones, ur: NEGATIVE mg/dL
Nitrite: POSITIVE — AB
Protein, ur: 30 mg/dL — AB
Specific Gravity, Urine: 1.018 (ref 1.005–1.030)
Squamous Epithelial / HPF: NONE SEEN (ref 0–5)
WBC, UA: >50 WBC/hpf — ABNORMAL HIGH (ref 0–5)
pH: 5 (ref 5.0–8.0)

## 2019-07-20 LAB — COMPREHENSIVE METABOLIC PANEL
ALT: 7 U/L (ref 0–44)
AST: 10 U/L — ABNORMAL LOW (ref 15–41)
Albumin: 3.2 g/dL — ABNORMAL LOW (ref 3.5–5.0)
Alkaline Phosphatase: 63 U/L (ref 38–126)
Anion gap: 8 (ref 5–15)
BUN: 40 mg/dL — ABNORMAL HIGH (ref 8–23)
CO2: 23 mmol/L (ref 22–32)
Calcium: 8.9 mg/dL (ref 8.9–10.3)
Chloride: 113 mmol/L — ABNORMAL HIGH (ref 98–111)
Creatinine, Ser: 1.08 mg/dL (ref 0.61–1.24)
GFR calc Af Amer: >60 mL/min (ref >60)
GFR calc non Af Amer: >60 mL/min (ref >60)
Glucose, Bld: 86 mg/dL (ref 70–99)
Potassium: 3.9 mmol/L (ref 3.5–5.1)
Sodium: 144 mmol/L (ref 135–145)
Total Bilirubin: 1.1 mg/dL (ref 0.3–1.2)
Total Protein: 5.9 g/dL — ABNORMAL LOW (ref 6.5–8.1)

## 2019-07-20 LAB — TROPONIN I (HIGH SENSITIVITY)
Troponin I (High Sensitivity): 11 ng/L (ref <18)
Troponin I (High Sensitivity): 12 ng/L (ref <18)

## 2019-07-20 MED ORDER — SODIUM CHLORIDE 0.9 % IV SOLN
1.0000 g | Freq: Once | INTRAVENOUS | Status: AC
  Administered 2019-07-20: 1 g via INTRAVENOUS
  Filled 2019-07-20: qty 10

## 2019-07-20 MED ORDER — SODIUM CHLORIDE 0.9 % IV SOLN: Freq: Once | INTRAVENOUS | Status: AC

## 2019-07-20 MED ORDER — CIPROFLOXACIN HCL 500 MG PO TABS: 500.0000 mg | ORAL_TABLET | Freq: Two times a day (BID) | ORAL | 0 refills | Status: AC

## 2019-07-20 NOTE — ED Notes (Signed)
Pt smiling and alert, NS stopped flowing and blood present under Kerlex at IV site; fluids clamped

## 2019-07-20 NOTE — ED Notes (Signed)
[facility] ([facility]) called att

## 2019-07-20 NOTE — ED Provider Notes (Signed)
[facility] Emergency Department Provider Note       Time seen: ----------------------------------------- 4:50 PM on 07/20/2019 ----------------------------------------- Level V caveat: History/ROS limited by altered mental status  I have reviewed the triage vital signs and the nursing notes.  HISTORY   Chief Complaint No chief complaint on file.    HPI Eugene [name] is a 86 y.o. male with a history of CVA, gout, cognitive disorder who presents to the ED for failure to thrive.  Patient is a resident of a nursing facility, reportedly has not been acting himself.  He was seen recently for a fall.  There is concerned he is dehydrated as well as he has had decreased intake.  No further information is available.  Past Medical History:  Diagnosis Date  . Cerebral infarct (HCC)   . Gout     There are no problems to display for this patient.   No past surgical history on file.  Allergies Patient has no known allergies.  Social History Social History   Tobacco Use  . Smoking status: Not on file  Substance Use Topics  . Alcohol use: Not on file  . Drug use: Not on file    Review of Systems Unknown, reported decreased activity and altered mental status  All systems negative/normal/unremarkable except as stated in the HPI  ____________________________________________   PHYSICAL EXAM:  VITAL SIGNS: ED Triage Vitals  Enc Vitals Group     BP      Pulse      Resp      Temp      Temp src      SpO2      Weight      Height      Head Circumference      Peak Flow      Pain Score      Pain Loc      Pain Edu?      Excl. in GC?     Constitutional: Alert but disoriented, no acute distress Eyes: Conjunctivae are normal. Normal extraocular movements. ENT      Head: Normocephalic and atraumatic.      Nose: No congestion/rhinnorhea.      Mouth/Throat: Mucous membranes are moist.      Neck: No stridor. Cardiovascular: Normal rate, regular  rhythm. No murmurs, rubs, or gallops. Respiratory: Normal respiratory effort without tachypnea nor retractions. Breath sounds are clear and equal bilaterally. No wheezes/rales/rhonchi. Gastrointestinal: Soft and nontender. Normal bowel sounds Musculoskeletal: Limited range of motion, no edema Neurologic: Generalized weakness, nothing focal Skin:  Skin is warm, dry and intact. No rash noted. Psychiatric: Mood and affect are normal.  ____________________________________________  EKG: Interpreted by me.  Underlying sinus rhythm with baseline artifact, rate is 61 bpm, first-degree AV block is noted, right bundle branch block, normal QT  ____________________________________________  ED COURSE:  As part of my medical decision making, I reviewed the following data within the electronic MEDICAL RECORD NUMBER History obtained from family if available, nursing notes, old chart and ekg, as well as notes from prior ED visits. Patient presented for altered mental status, we will assess with labs and imaging as indicated at this time.   Procedures  Eugene [name] was evaluated in Emergency Department on 07/20/2019 for the symptoms described in the history of present illness. He was evaluated in the context of the global COVID-19 pandemic, which necessitated consideration that the patient might be at risk for infection with the SARS-CoV-2 virus that causes COVID-19. Institutional  protocols and algorithms that pertain to the evaluation of patients at risk for COVID-19 are in a state of rapid change based on information released by regulatory bodies including the CDC and federal and state organizations. These policies and algorithms were followed during the patient's care in the ED.  ____________________________________________   LABS (pertinent positives/negatives)  Labs Reviewed  CBC WITH DIFFERENTIAL/PLATELET - Abnormal; Notable for the following components:      Result Value   RBC 4.14 (*)    Hemoglobin  12.2 (*)    HCT 38.4 (*)    All other components within normal limits  COMPREHENSIVE METABOLIC PANEL - Abnormal; Notable for the following components:   Chloride 113 (*)    BUN 40 (*)    Total Protein 5.9 (*)    Albumin 3.2 (*)    AST 10 (*)    All other components within normal limits  URINALYSIS, COMPLETE (UACMP) WITH MICROSCOPIC - Abnormal; Notable for the following components:   Color, Urine YELLOW (*)    APPearance TURBID (*)    Hgb urine dipstick MODERATE (*)    Protein, ur 30 (*)    Nitrite POSITIVE (*)    Leukocytes,Ua LARGE (*)    WBC, UA >50 (*)    All other components within normal limits  URINE CULTURE  TROPONIN I (HIGH SENSITIVITY)  TROPONIN I (HIGH SENSITIVITY)    RADIOLOGY Images were viewed by me  CT head, chest x-ray IMPRESSION:  1. No acute intracranial abnormality.  2. Severe age related generalized atrophy and severe chronic  microvascular ischemic changes of the white matter, more focally in  the RIGHT POSTERIOR parietal lobe at the vertex.  3. Remote lacunar stroke involving the LEFT superior cerebellar  hemisphere.  IMPRESSION:  Low volume film with cardiomegaly and chronic interstitial  coarsening.   8 mm nodular density in the right parahilar lung, not definitely  seen previously. CT chest without contrast could be used to further  evaluate as clinically warranted.  ____________________________________________   DIFFERENTIAL DIAGNOSIS   Dehydration, electrolyte abnormality, occult infection, CVA, MI, dementia  FINAL ASSESSMENT AND PLAN  Altered mental status, UTI, dehydration   Plan: The patient had presented for altered mental status with possible dehydration. Patient's labs did indicate some dehydration and urinary tract infection for which she was given IV fluids and IV Rocephin. Patient's imaging not reveal any acute process.  We have sent a urine culture and he will be discharged home with antibiotics.  He is cleared for outpatient  follow-up. Family agrees with plan.   Laurence Aly, MD    Note: This note was generated in part or whole with voice recognition software. Voice recognition is usually quite accurate but there are transcription errors that can and very often do occur. I apologize for any typographical errors that were not detected and corrected.     Earleen Newport, MD 07/20/19 1910

## 2019-07-20 NOTE — ED Triage Notes (Signed)
Pt arrived via [facility] from [facility]. EMS stated pt's family wanted him to be checked out for eye drainage as well as possible dehydration.

## 2019-07-20 NOTE — ED Notes (Signed)
Pt lying in bed alert; IV positional; pt with hearing aids but nonverbal; nods intermittently

## 2019-07-23 LAB — URINE CULTURE: Culture: 90000 — AB

## 2019-08-07 ENCOUNTER — Other Ambulatory Visit: Payer: Self-pay

## 2019-08-07 ENCOUNTER — Non-Acute Institutional Stay: Payer: Medicare Other | Admitting: Primary Care

## 2019-08-07 DIAGNOSIS — Z515 Encounter for palliative care: Secondary | ICD-10-CM

## 2019-08-07 NOTE — Progress Notes (Signed)
[facility] Pall[facility] Consult Note Telephone: [phone]  Fax: [phone]  TELEHEALTH VISIT STATEMENT Due to the COVID-19 crisis, this visit was done via telemedicine from my office. It was initiated and consented to by this patient and/or family.  PATIENT NAME: Eugene [name] Eugene [name] Eugene [name] [id number] [phone] (home)  DOB: Jun 04, 1930 MRN: [id number]  PRIMARY CARE PROVIDER:   Marisa Hua, MD, [place] [address] [phone]  REFERRING PROVIDER:  Marisa Hua, MD [address] [place],  [place] [address] [phone]  RESPONSIBLE PARTY:   Extended Emergency Contact Information Primary Emergency Contact: Darnelle Maffucci Address: [address]          [place],  [address] Home Phone: [phone] Mobile Phone: [phone] Relation: Daughter Secondary Emergency Contact: Alona Bene Mobile Phone: [phone] Relation: Daughter  I met with Eugene [name] today in the long-term facility where he lives with Rockne Menghini RN ASSESSMENT AND RECOMMENDATIONS:   1. Advance Care Planning/Goals of Care: Goals include to maximize quality of life and symptom management. ACP outlines DNR. I put a call to his POA Eugene [name] is done to discuss any concerns. I put a call to his POA Eugene [name]  to discuss any concerns.  Message left for comments/ questions.  2. Symptom Management:   Nutrition:  Eats about 75%. Current weight is 135 lbs, which is a 5 lb wt loss in several months. Recommend supplements per facility policy, increased fluids for possible dehydration. Recommend weekly weights.  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 He smiled and gave [place] a thumbs up when asked how he was doing. Remains at risk for sensory isolation due to deafness. Use of white board needed to converse.  Decline: Recent ED trips for weakness, uti, dehydration. On todays visit  appears alert and interactive. Staff is reporting some physical decline. Recommend po fluid support/encouragment. Monitor closely for recurrence of UTI.   3. Family /Caregiver/Community Supports: Daughter is Designer, industrial/product, lives in the community nearby. Patient is in [place].  4. Cognitive / Functional decline: Appears at baseline, difficult to assess due to hearing loss. Has lost some weight, 5 lbs since Dec.  5. Follow up Palliative Care Visit: Palliative care will continue to follow for goals of care clarification and symptom management. Return 3-4 weeks or prn.  I spent 25 minutes providing this consultation,  from 1000 to 1025. More than 50% of the time in this consultation was spent coordinating communication.   HISTORY OF PRESENT ILLNESS:  Eugene [name] is a 86 y.o. year old male with multiple medical problems including recent UTI, dehydration, hearing loss, weight loss. Palliative Care was asked to follow this patient by consultation request of Marisa Hua, MD to help address advance care planning and goals of care. This is a follow up visit.  CODE STATUS: DNR  PPS: 30% HOSPICE ELIGIBILITY/DIAGNOSIS: TBD  PAST MEDICAL HISTORY:  Past Medical History:  Diagnosis Date  . Cerebral infarct ([place])   . Gout     SOCIAL HX:  Social History   Tobacco Use  . Smoking status: Not on file  Substance Use Topics  . Alcohol use: Not on file    ALLERGIES: No Known Allergies   PERTINENT MEDICATIONS:  Outpatient Encounter Medications as of 08/07/2019  Medication Sig  . allopurinol (ZYLOPRIM) 300 MG tablet Take 150 mg by mouth daily.  Marland Kitchen aspirin EC 81  MG tablet Take 81 mg by mouth daily.  . cholecalciferol (VITAMIN D3) 10 MCG (400 UNIT) TABS tablet Take 400 Units by mouth daily.  . Melatonin 5 MG TABS Take 5 mg by mouth at bedtime.   . methimazole (TAPAZOLE) 5 MG tablet Take 2.5 mg by mouth daily.  Marland Kitchen omeprazole (PRILOSEC) 20 MG capsule Take 20 mg by mouth daily.  . polyethylene glycol powder  (GLYCOLAX/MIRALAX) 17 GM/SCOOP powder Take 1 Container by mouth once.  . sertraline (ZOLOFT) 25 MG tablet Take 25 mg by mouth daily.  . tamsulosin (FLOMAX) 0.4 MG CAPS capsule Take 0.8 mg by mouth.   No facility-administered encounter medications on file as of 08/07/2019.    PHYSICAL EXAM / ROS:   Current and past weights: 135 lbs, 5 lbs in 2 months (5%) General: NAD, frail appearing, thin Cardiovascular: no chest pain reported, no edema Pulmonary: no cough, no increased SOB, room air Abdomen: appetite fair to good, 75%, denies constipation, incontinent of bowel GU: denies dysuria, incontinent of urine, recent UTI MSK:  no joint deformities,non  ambulatory Skin: no rashes or wounds reported Neurological: Weakness, hearing loss, alert and oriented x 1  Jason Coop, NP [facility]

## 2019-09-26 ENCOUNTER — Other Ambulatory Visit: Payer: Self-pay

## 2019-09-26 ENCOUNTER — Non-Acute Institutional Stay: Payer: Medicare Other | Admitting: Primary Care

## 2019-09-26 DIAGNOSIS — Z515 Encounter for palliative care: Secondary | ICD-10-CM

## 2019-09-26 NOTE — Progress Notes (Signed)
[facility] Pall[facility] Consult Note Telephone: [phone]  Fax: [phone]    PATIENT NAME: Eugene [name] [id number] [phone] (home)  DOB: 21-Jan-1930 MRN: [id number]  PRIMARY CARE PROVIDER:   Marisa Hua, MD, [place] [address] [phone]  REFERRING PROVIDER:  Marisa Hua, MD [address] [place],  [place] [address] [phone]  RESPONSIBLE PARTY:   Extended Emergency Contact Information Primary Emergency Contact: Darnelle Maffucci Address: [address]          [place], [place] [address] Home Phone: [phone] Mobile Phone: [phone] Relation: Daughter Secondary Emergency Contact: Alona Bene Mobile Phone: [phone] Relation: Daughter   I met with patient in the  facility.  ASSESSMENT AND RECOMMENDATIONS:   1. Advance Care Planning/Goals of Care: Goals include to maximize quality of life and symptom management.T/c to POA to discuss status. Daughter Bethena Roys said he was doing well. He is hearing perhaps some better as he was seen by audiology recently for ear cleaning. She was concerned he may be getting more dementia or uti b/c he is convinced his brother has died, which he has not. We discussed possible visitation when the SNF opens up so he can see his brother.   2. Symptom Management:   UTI: Dx in Feb, daughter concerned his distress over his brother is sign of UTI. I provided education that type of delirium is not selective, and he would be disoriented about other things if this were the case. However continue to monitor for s/sx uti eg elevation of temperature 2 degrees > normal baseline, painful urination, hyper or hypo active delirium.  Weakness: Had an episode of falling after covid injection. Has recovered from this episode, it was an isolated fall.  Depression: Crying at alleged death of brother. Perhaps this was a vivid dream or  hallucination.Recommend increase of SSRI to 50 mg sertraline daily.  Monitor for hallucinations.  3. Family /Caregiver/Community Supports: Daughter Bethena Roys is POA. Lives in [place].  4. Cognitive / Functional decline: Alert, oriented x 1-2. Conversant but with limitations of hearing loss. Reads lips. Sits in w/c most of time and ambulates self in it. Needs assistance with most adls, all iadls.    5. Follow up Palliative Care Visit: Palliative care will continue to follow for goals of care clarification and symptom management. Return 4-6 weeks or prn.  I spent 35 minutes providing this consultation,  from 1055 to 1130. More than 50% of the time in this consultation was spent coordinating communication.   HISTORY OF PRESENT ILLNESS:  Eugene [name] is a 86 y.o. year old male with multiple medical problems including hearing loss, depression, weakness, s/p cva. Palliative Care was asked to follow this patient by consultation request of Marisa Hua, MD to help address advance care planning and goals of care. This is a follow up visit.  CODE STATUS:  DNR  PPS: 30% HOSPICE ELIGIBILITY/DIAGNOSIS: TBD  PAST MEDICAL HISTORY:  Past Medical History:  Diagnosis Date  . Cerebral infarct ([place])   . Gout     SOCIAL HX:  Social History   Tobacco Use  . Smoking status: Not on file  Substance Use Topics  . Alcohol use: Not on file    ALLERGIES: No Known Allergies   PERTINENT MEDICATIONS:  Outpatient Encounter Medications as of 09/26/2019  Medication Sig  . allopurinol (ZYLOPRIM) 300 MG tablet Take 150 mg by mouth daily.  Marland Kitchen aspirin EC  81 MG tablet Take 81 mg by mouth daily.  . cholecalciferol (VITAMIN D3) 10 MCG (400 UNIT) TABS tablet Take 400 Units by mouth daily.  . Melatonin 5 MG TABS Take 5 mg by mouth at bedtime.   . methimazole (TAPAZOLE) 5 MG tablet Take 2.5 mg by mouth daily.  Marland Kitchen omeprazole (PRILOSEC) 20 MG capsule Take 20 mg by mouth daily.  . polyethylene glycol powder  (GLYCOLAX/MIRALAX) 17 GM/SCOOP powder Take 1 Container by mouth once.  . sertraline (ZOLOFT) 25 MG tablet Take 25 mg by mouth daily.  . tamsulosin (FLOMAX) 0.4 MG CAPS capsule Take 0.8 mg by mouth.   No facility-administered encounter medications on file as of 09/26/2019.    PHYSICAL EXAM / ROS:   Current and past weights:  137 lbs, a gain of several pounds General: NAD, frail appearing, thin Cardiovascular: S1S2, no chest pain reported, no edema Pulmonary: no cough, no increased SOB, lungs CTA bil Abdomen: appetite fair, 50% intake, denies constipation, incontinent of bowel GU: denies dysuria, incontinent of urine MSK:  no joint deformities, non ambulatory, oob in w/c Skin: occ skin tears Neurological: Weakness, severe hearing loss, depression, possible hallucinations  Jason Coop, NP [facility]  COVID-19 PATIENT SCREENING TOOL  Person answering questions: ____________Staff_______ _____   1.  Is the patient or any family member in the home showing any signs or symptoms regarding respiratory infection?               Person with Symptom- __________NA_________________  a. Fever                                                                          Yes___ No___          ___________________  b. Shortness of breath                                                    Yes___ No___          ___________________ c. Cough/congestion                                       Yes___  No___         ___________________ d. Body aches/pains                                                         Yes___ No___        ____________________ e. Gastrointestinal symptoms (diarrhea, nausea)           Yes___ No___        ____________________  2. Within the past 14 days, has anyone living in the home had any contact with someone with or under investigation for COVID-19?    Yes___ No_X_   Person __________________

## 2019-10-21 ENCOUNTER — Telehealth: Payer: Self-pay | Admitting: Primary Care

## 2019-10-21 DIAGNOSIS — Z515 Encounter for palliative care: Secondary | ICD-10-CM

## 2019-10-21 NOTE — Telephone Encounter (Signed)
Daughter called to discuss patient's status at [facility]. He has been in great pain sitting in his chair all day. He has no breakdown, but has lost a lot of weight and now is uncomfortable sitting on his coccyx. Daughter Darel Hong has requested he be put in bed for relief which has just happened. I suggested she ask for this to be put in the care plan. I also let her know of her ombudsman rights. The SNF is  also going to get a recliner for patient to rest in during the day. Discussed daughter's concerns for 30 min, from 0930 to 1000 this am. I will f/u with patient visit on my next trip to [facility]. Daughter voiced appreciation for discussion.

## 2019-10-31 ENCOUNTER — Non-Acute Institutional Stay: Payer: Medicare Other | Admitting: Primary Care

## 2019-10-31 ENCOUNTER — Other Ambulatory Visit: Payer: Self-pay

## 2019-10-31 DIAGNOSIS — Z515 Encounter for palliative care: Secondary | ICD-10-CM

## 2019-10-31 NOTE — Progress Notes (Signed)
[facility] Pall[facility] Consult Note Telephone: [phone]  Fax: [phone]  PATIENT NAME: Eugene [name] [id number] [phone] (home)  DOB: [birth year] MRN: [id number]  PRIMARY CARE PROVIDER:    Marisa Hua, MD,  [place] [address] [phone]  REFERRING PROVIDER:   Marisa Hua, MD [address] [place],  [place] [address] [phone]  RESPONSIBLE PARTY:   Extended Emergency Contact Information Primary Emergency Contact: Darnelle Maffucci Address: [address]          [place],  [address] Home Phone: [phone] Mobile Phone: [phone] Relation: Daughter Secondary Emergency Contact: Alona Bene Mobile Phone: [phone] Relation: Daughter    I met with patient in the  facility.  ASSESSMENT AND RECOMMENDATIONS:   1. Advance Care Planning/Goals of Care: Goals include to maximize quality of life and symptom management. Talked to daughter Darnelle Maffucci about patient goals of care: Pain relief and communication. Pt has DNR on file.  2. Symptom Management:   Pain: Improved with tramadol. However was not on ATC acetaminophen. Recommend Acetaminophen CR 650 mg  q 8 hrs. ATC with tramadol for pain management.  Sensory deprivation: Needs for essentially total deafness. Has recent assessment by ENT and audiology and no compensation can be made for hearing loss. Needs communication board for writing for pt understanding. Also needs eye exam as he has been complaining about loss of vision. Recommend eye exam asap.  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 Recommend systane eye drops for hydration qid.   Depression: Begin on sertraline 25 mg x 2 weeks, then 50 mg. Daughter also recognizes depression. Rx will help with mood and pain.  3. Family /Caregiver/Community Supports: daughter Bethena Roys is POA. Lives in [place].  4. Cognitive / Functional  decline: a and O x 1-2. Deafness makes communication difficult. Patient is in w/c today. New recliner has been obtained and staff reports he gets in it after lunch.  5. Follow up Palliative Care Visit: Palliative care will continue to follow for goals of care clarification and symptom management. Return 4-6 weeks or prn.  I spent 35 minutes providing this consultation,  from 1100 to 1135 . More than 50% of the time in this consultation was spent coordinating communication.   HISTORY OF PRESENT ILLNESS:  Eugene [name] is a 86 y.o. year old male with multiple medical problems including R hemiparesis, deafness, low vision, depression. Palliative Care was asked to follow this patient by consultation request of Marisa Hua, MD to help address advance care planning and goals of care. This is a follow up visit.  CODE STATUS: DNR  PPS: 30% HOSPICE ELIGIBILITY/DIAGNOSIS: TBD  PAST MEDICAL HISTORY:  Past Medical History:  Diagnosis Date  . Cerebral infarct ([place])   . Gout     SOCIAL HX:  Social History   Tobacco Use  . Smoking status: Not on file  Substance Use Topics  . Alcohol use: Not on file    ALLERGIES: No Known Allergies   PERTINENT MEDICATIONS:  Outpatient Encounter Medications as of 84/21/2021  Medication Sig  . tamsulosin (FLOMAX) 0.4 MG CAPS capsule Take 0.8 mg by mouth.  Marland Kitchen allopurinol (ZYLOPRIM) 300 MG tablet Take 150 mg by mouth daily.  Marland Kitchen aspirin EC 81 MG tablet Take 81 mg by mouth daily.  . cholecalciferol (VITAMIN D3) 10 MCG (400 UNIT) TABS tablet Take 400 Units by mouth daily.  Marland Kitchen  Melatonin 5 MG TABS Take 5 mg by mouth at bedtime.   . methimazole (TAPAZOLE) 5 MG tablet Take 2.5 mg by mouth daily.  Marland Kitchen omeprazole (PRILOSEC) 20 MG capsule Take 20 mg by mouth daily.  . polyethylene glycol powder (GLYCOLAX/MIRALAX) 17 GM/SCOOP powder Take 1 Container by mouth once.  . [DISCONTINUED] sertraline (ZOLOFT) 25 MG tablet Take 25 mg by mouth daily.   No facility-administered  encounter medications on file as of 10/31/2019.    PHYSICAL EXAM / ROS:   Current and past weights: 137 lbs, slight increase General: NAD, frail appearing, thin Cardiovascular: no chest pain reported, no edema  Pulmonary: no cough, no increased SOB, room air Abdomen: appetite fair,  incontinent of bowel GU: denies dysuria, incontinent of urine MSK:  Significant  joint and ROM abnormalities, non ambulatory, in w/c for Cva patient. Skin: no rashes or wounds reported Neurological: Weakness,Right hemiparesis, foot brace  Jason Coop, NP [facility]  COVID-19 PATIENT SCREENING TOOL  Person answering questions: ____________Staff_______ _____   1.  Is the patient or any family member in the home showing any signs or symptoms regarding respiratory infection?               Person with Symptom- __________NA_________________  a. Fever                                                                          Yes___ No___          ___________________  b. Shortness of breath                                                    Yes___ No___          ___________________ c. Cough/congestion                                       Yes___  No___         ___________________ d. Body aches/pains                                                         Yes___ No___        ____________________ e. Gastrointestinal symptoms (diarrhea, nausea)           Yes___ No___        ____________________  2. Within the past 14 days, has anyone living in the home had any contact with someone with or under investigation for COVID-19?    Yes___ No_X_   Person __________________

## 2019-11-28 ENCOUNTER — Other Ambulatory Visit: Payer: Self-pay

## 2019-11-28 ENCOUNTER — Non-Acute Institutional Stay: Payer: Medicare Other | Admitting: Primary Care

## 2019-12-10 ENCOUNTER — Non-Acute Institutional Stay: Payer: Medicare Other | Admitting: Primary Care

## 2019-12-10 ENCOUNTER — Other Ambulatory Visit: Payer: Self-pay

## 2019-12-10 DIAGNOSIS — Z515 Encounter for palliative care: Secondary | ICD-10-CM

## 2019-12-10 NOTE — Progress Notes (Signed)
[facility] Palliative Care Consult Note Telephone: [phone]  Fax: [phone]  PATIENT NAME: Eugene [name] [id number] [phone] (home)  DOB: 1930/04/05 MRN: [id number]  PRIMARY CARE PROVIDER:    Marisa Hua, MD,  [place] [address] [phone]  REFERRING PROVIDER:   Marisa Hua, MD [address] [place],  [place] [address] [phone]  RESPONSIBLE PARTY:   Extended Emergency Contact Information Primary Emergency Contact: Darnelle Maffucci Address: [address]          [place], [place] [address] Home Phone: [phone] Mobile Phone: [phone] Relation: Daughter Secondary Emergency Contact: Alona Bene Mobile Phone: [phone] Relation: Daughter  I met with patient in the facility.  ASSESSMENT AND RECOMMENDATIONS:   1. Advance Care Planning/Goals of Care: Goals include to maximize quality of life and symptom management. ACP on file I will reach out to daughter Bethena Roys to discuss any upcoming needs or concerns.   2. Symptom Management:   Pain: Appears comfortable today, dozing after lunch in recliner. When roused, he opened his eyes and smiled. Appears to have little to no pain.Now on Acetaminophen 650 mg q 8 hrs (not time released per [facility]; CR was recommended in the past).  Mobility: Has been getting in w/c for some of today but currently in recliner which he prefers. Recommend putting water within reach as he is not able to get oob chair safely to obtain.   3. Family /Caregiver/Community Supports: Daughter is POA. Family visits frequently. Lives in [place].  4. Cognitive / Functional decline: A and O x 1. Hearing loss impacts cognition and communications. Needs help with all iadls and most adls.   5. Follow up Palliative Care Visit: Palliative care will continue to follow for goals of care clarification and symptom management. Return 6 weeks or prn.  I spent 25  minutes providing this consultation,  from 1430 to 1455. More than 50% of the time in this consultation was spent coordinating communication.   HISTORY OF PRESENT ILLNESS:  Eugene [name] is a 86 y.o. year old male with multiple medical problems including falls, depression, pain, immobility. Palliative Care was asked to follow this patient by consultation request of Marisa Hua, MD to help address advance care planning and goals of care. This is a follow up visit.  CODE STATUS: DNR  PPS: 40%  HOSPICE ELIGIBILITY/DIAGNOSIS: no  PAST MEDICAL HISTORY:  Past Medical History:  Diagnosis Date  . Cerebral infarct ([employer])   . Gout     SOCIAL HX:  Social History   Tobacco Use  . Smoking status: Not on file  Substance Use Topics  . Alcohol use: Not on file    ALLERGIES: No Known Allergies   PERTINENT MEDICATIONS:  Outpatient Encounter Medications as of 12/10/2019  Medication Sig  . acetaminophen (TYLENOL) 325 MG tablet Take 650 mg by mouth every 8 (eight) hours.  Marland Kitchen allopurinol (ZYLOPRIM) 300 MG tablet Take 150 mg by mouth daily.  Marland Kitchen aspirin EC 81 MG tablet Take 81 mg by mouth daily.  . cholecalciferol (VITAMIN D3) 10 MCG (400 UNIT) TABS tablet Take 800 Units by mouth daily.   . Melatonin 5 MG TABS Take 3 mg by mouth at bedtime.   . methimazole (TAPAZOLE) 5 MG tablet Take 2.5 mg by mouth daily.  Marland Kitchen omeprazole (PRILOSEC) 20 MG capsule Take 20 mg by mouth daily.  . polyethylene glycol powder (GLYCOLAX/MIRALAX) 17 GM/SCOOP powder Take 1  Container by mouth once.  . tamsulosin (FLOMAX) 0.4 MG CAPS capsule Take 0.8 mg by mouth.  . traMADol (ULTRAM) 50 MG tablet Take 50 mg by mouth every 8 (eight) hours as needed for moderate pain.    No facility-administered encounter medications on file as of 12/10/2019.    PHYSICAL EXAM / ROS:   Current and past weights: 138 lbs General: NAD, frail appearing, thin Cardiovascular: no chest pain reported, no  LE  edema  Pulmonary: no cough, no  increased SOB, room air Abdomen: appetite fair, endorses constipation, continent of bowel GU: denies dysuria, incontinent of urine MSK:  ++ joint and ROM abnormalities, non ambulatory, oob in recliner Skin: no rashes or wounds reported or observed on gross exam Neurological: Weakness, deaf, not able to report pain but PAINAD appears to be 2/10. Dementia  Jason Coop, NP [facility]  COVID-19 PATIENT SCREENING TOOL  Person answering questions: ____________Staff_______ _____   1.  Is the patient or any family member in the home showing any signs or symptoms regarding respiratory infection?               Person with Symptom- __________NA_________________  a. Fever                                                                          Yes___ No___          ___________________  b. Shortness of breath                                                    Yes___ No___          ___________________ c. Cough/congestion                                       Yes___  No___         ___________________ d. Body aches/pains                                                         Yes___ No___        ____________________ e. Gastrointestinal symptoms (diarrhea, nausea)           Yes___ No___        ____________________  2. Within the past 14 days, has anyone living in the home had any contact with someone with or under investigation for COVID-19?    Yes___ No_X_   Person __________________

## 2019-12-11 ENCOUNTER — Telehealth: Payer: Self-pay | Admitting: Primary Care

## 2019-12-11 NOTE — Telephone Encounter (Signed)
T/c to Eugene [name], daughter to discuss father's status. She states he is sometimes hallucinating. She is concerned RE pain control and sitting on his coccyx. I have suggested gel cushion. We discussed getting CR acetaminophen. Discussion x 20 minutes.

## 2020-02-05 ENCOUNTER — Other Ambulatory Visit: Payer: Self-pay

## 2020-02-05 ENCOUNTER — Telehealth: Payer: Self-pay | Admitting: Primary Care

## 2020-02-05 ENCOUNTER — Non-Acute Institutional Stay: Payer: Medicare Other | Admitting: Primary Care

## 2020-02-05 DIAGNOSIS — Z515 Encounter for palliative care: Secondary | ICD-10-CM

## 2020-02-05 NOTE — Progress Notes (Signed)
  [facility] Consult Note Telephone: [phone]  Fax: [phone]  PATIENT NAME: Eugene [name] [address] [place] [address] [phone] (home)  DOB: 08/04/1929 MRN: [id number]  PRIMARY CARE PROVIDER:    Henry-Smith, Carol, MD,  [address] [place] [address] [phone]  REFERRING PROVIDER:   Henry-Smith, Carol, MD [address] [place],  [place] [address] [phone]  RESPONSIBLE PARTY:   Extended Emergency Contact Information Primary Emergency Contact: Cobb, Judy Address: [address]          [place], [place] [address] Home Phone: [phone] Mobile Phone: [phone] Relation: Daughter Secondary Emergency Contact: Woody, Carman Mobile Phone: [phone] Relation: Daughter  I met face to face with patient facility. Spoke with poa Judy Cobb by phone due to covid shut down  ASSESSMENT AND RECOMMENDATIONS:   1. Advance Care Planning/Goals of Care: Goals include to maximize quality of life and symptom management. Discussed goals of care with POA re treating in place and avoiding hospital returns. She states he was rx in place but became combative a year ago. He is much declined now.  Discussed progression of disease and hospice appropriateness due to steadily declining function.   2. Symptom Management:  Discussed decline with daughter and readiness for hospice supportive care. Pt would benefit from comfort oriented care. Discussed with POA Judy who agrees pt is in decline, painful much of the time, sleeping more, less po intake. She would like to pursue hospice admission for father.  States he is not eating as much as previously. Staff endorses he is not eating well and recently constipated. Goals for care include comfort care.  3. Follow up Palliative Care Visit: recommend hospice admission, Return 2-4 weeks or prn if not admitted to hospice  4. Family /Caregiver/Community Supports: Daughter  is poa. Wife is living with daughter, has not see her.  Has had some window visits. Endorses isolation.  5. Cognitive / Functional decline: A and o x 1. No hearing, use white board. This is not optimal and pt is isolated.  I spent 35 minutes providing this consultation,  from 1430 to 1505. More than 50% of the time in this consultation was spent coordinating communication.   CHIEF COMPLAINT: Debility, protein calorie malnutrition  HISTORY OF PRESENT ILLNESS:  Eugene [name] is a 86 y.o. year old male with multiple medical problems including CVA, sacral wounds, increased immobility, HOH, dysarthria. Palliative Care was asked to follow this patient by consultation request of Henry-Smith, Carol, MD to help address advance care planning and goals of care. This is a follow up visit.  CODE STATUS: DNR PPS: 30%  HOSPICE ELIGIBILITY/DIAGNOSIS: yes/CVA, malnutrition  PAST MEDICAL HISTORY:   Active Ambulatory Problems    Diagnosis Date Noted  . Barrett's esophagus 04/08/2014  . Bilateral edema of lower extremity 12/02/2014  . Bilateral impacted cerumen 04/08/2014  . Closed right hip fracture, initial encounter (HCC) 03/19/2016  . DOE (dyspnea on exertion) 03/31/2015  . Dysarthria following cerebrovascular accident 12/11/2013  . Fatigue 03/31/2015  . Hypokalemia with normal acid-base balance 12/11/2013  . Hypertension 01/05/2014  . Incontinence 05/27/2014  . Mixed hyperlipidemia 08/31/2014  . Increased frequency of urination 01/07/2016  . Neuropathy 04/08/2014  . Nocturnal muscle cramps 12/02/2014  . Renal insufficiency 05/27/2014  . Hemiparesis affecting right side as late effect of cerebrovascular accident (HCC) 12/11/2013  . Pressure ulcer, stage 1 11/03/2015  . Spastic hemiparesis affecting dominant side (HCC) 02/25/2015  .   Somnolence 08/31/2014   Resolved Ambulatory Problems    Diagnosis Date Noted  . No Resolved Ambulatory Problems   Past Medical History:  Diagnosis Date  .  Cerebral infarct ([place])   . Gout     SOCIAL HX:  Social History   Tobacco Use  . Smoking status: Not on file  Substance Use Topics  . Alcohol use: Not on file   FAMILY HX: No family history on file.  ALLERGIES: No Known Allergies   PERTINENT MEDICATIONS:  Outpatient Encounter Medications as of 02/05/2020  Medication Sig  . acetaminophen (TYLENOL) 325 MG tablet Take 650 mg by mouth every 8 (eight) hours.  Marland Kitchen allopurinol (ZYLOPRIM) 300 MG tablet Take 150 mg by mouth daily.  Marland Kitchen aspirin EC 81 MG tablet Take 81 mg by mouth daily.  . cholecalciferol (VITAMIN D3) 10 MCG (400 UNIT) TABS tablet Take 800 Units by mouth daily.   . Melatonin 5 MG TABS Take 3 mg by mouth at bedtime.   . methimazole (TAPAZOLE) 5 MG tablet Take 2.5 mg by mouth daily.  Marland Kitchen omeprazole (PRILOSEC) 20 MG capsule Take 20 mg by mouth daily.  . polyethylene glycol powder (GLYCOLAX/MIRALAX) 17 GM/SCOOP powder Take 1 Container by mouth once.  . tamsulosin (FLOMAX) 0.4 MG CAPS capsule Take 0.8 mg by mouth.  . traMADol (ULTRAM) 50 MG tablet Take 50 mg by mouth every 8 (eight) hours as needed for moderate pain.    No facility-administered encounter medications on file as of 02/05/2020.    PHYSICAL EXAM / ROS:   Current and past weights: 1 year ago 9/20 wt was 140 lbs.June 2021 138 lbs.  Requested latest weight. General: NAD, frail appearing, thin, deafness Cardiovascular: no chest pain reported, no  LEedema  Pulmonary: no cough, no increased SOB, room air Abdomen: appetite fair to poor, endorses constipation, incontinent of bowel GU: denies dysuria, incontinent of urine MSK:  ++ joint and ROM abnormalities, non ambulatory, oob to chair, more time in bed Skin: stage 2 on sacrum per report Neurological: Weakness,increased sleep, h/o hallucinations, pain not well managed, dementia  Jason Coop, NP , DNP, MPH, [facility]  COVID-19 PATIENT SCREENING TOOL  Person answering questions: ___________staff_____ _____   1.   Is the patient or any family member in the home showing any signs or symptoms regarding respiratory infection?               Person with Symptom- __________NA_________________  a. Fever                                                                          Yes___ No___          ___________________  b. Shortness of breath                                                    Yes___ No___          ___________________ c. Cough/congestion  Yes___  No___         ___________________ d. Body aches/pains                                                         Yes___ No___        ____________________ e. Gastrointestinal symptoms (diarrhea, nausea)           Yes___ No___        ____________________  2. Within the past 14 days, has anyone living in the home had any contact with someone with or under investigation for COVID-19?    Yes___ No_X_   Person __________________   

## 2020-02-06 NOTE — Telephone Encounter (Signed)
Opened in error

## 2020-02-07 ENCOUNTER — Telehealth: Payer: Self-pay | Admitting: Primary Care

## 2020-02-07 NOTE — Telephone Encounter (Signed)
8/1 = 136.2 lbs reported by nursing home. Need more recent weight and report to me Monday. I am discussing hospice services with his daughter.

## 2020-03-08 ENCOUNTER — Non-Acute Institutional Stay: Payer: Medicare Other | Admitting: Primary Care

## 2020-03-08 ENCOUNTER — Other Ambulatory Visit: Payer: Self-pay

## 2020-03-08 DIAGNOSIS — Z515 Encounter for palliative care: Secondary | ICD-10-CM

## 2020-03-08 DIAGNOSIS — I69322 Dysarthria following cerebral infarction: Secondary | ICD-10-CM

## 2020-03-08 DIAGNOSIS — I69351 Hemiplegia and hemiparesis following cerebral infarction affecting right dominant side: Secondary | ICD-10-CM

## 2020-03-08 NOTE — Progress Notes (Signed)
[facility] Consult Note Telephone: [phone]  Fax: [phone]  PATIENT NAME: Eugene [name] [id number] [phone] (home)  DOB: 1929/09/13 MRN: [id number]  PRIMARY CARE PROVIDER:    Alvester Morin, MD [phone] [place]. [place] [address]  REFERRING PROVIDER:   Alvester Morin, MD [place]. [place],  [place] [address]   RESPONSIBLE PARTY:   Extended Emergency Contact Information Primary Emergency Contact: Darnelle Maffucci Address: [address]          [place],  [address] Home Phone: [phone] Mobile Phone: [phone] Relation: Daughter Secondary Emergency Contact: Alona Bene Mobile Phone: [phone] Relation: Daughter  I met face to face with patient in facility.  ASSESSMENT AND RECOMMENDATIONS:   1. Advance Care Planning/Goals of Care: Goals include to maximize quality of life and symptom management. Spoke with POA who also visited today. She states he said he was hungry, and has also still uncomfortable in the chair. Discussed  Non hospice admission, he continues to have much discomfort from sitting on his boney prominences.   2. Symptom Management:   Nutrition: Daughter outlines pt is a very specific eater, must have a mechanical soft diet. I asked his daughter to outline what foods he will eat. Recommend to meet with nutritionist to record what he will eat. He does like breakfast usually. Recommend snacks twice daily recommend BOOST or similar with each meal.   Dementia: Pt thinks his brother is living, but he has been dead x 12 years. Encouraged to not repeatedly tell pt his brother has died as he cannot remember.Periods of lucidity, and confusion. Staff reports some combativeness but this is infrequent.  3. Follow up Palliative Care Visit: Palliative care will continue to follow for goals of care clarification and symptom management. Return  8 weeks or prn.  4. Family /Caregiver/Community Supports: Daughter is Designer, industrial/product, lives in [place].  5. Cognitive / Functional decline: A and O x 1-2 , needs assistance with all adls, can feed self if he is set up.    I spent 35 minutes providing this consultation,  from 1330 to 1405. More than 50% of the time in this consultation was spent coordinating communication.   CHIEF COMPLAINT: Weight loss, poor intake.  HISTORY OF PRESENT ILLNESS:  Eugene [name] is a 86 y.o. year old male with multiple medical problems including staff. Palliative Care was asked to follow this patient by consultation request of Slade-Hartman, Ivette Loyal* to help address advance care planning and goals of care. This is a follow up visit.  CODE STATUS: DNR  PPS: 30%  HOSPICE ELIGIBILITY/DIAGNOSIS: TBD  PAST MEDICAL HISTORY:  Past Medical History:  Diagnosis Date   Cerebral infarct ([place])    Gout     SOCIAL HX:  Social History   Tobacco Use   Smoking status: Not on file  Substance Use Topics   Alcohol use: Not on file   FAMILY HX: No family history on file.  ALLERGIES: No Known Allergies   PERTINENT MEDICATIONS:  Outpatient Encounter Medications as of 03/08/2020  Medication Sig   acetaminophen (TYLENOL) 325 MG tablet Take 650 mg by mouth every 8 (eight) hours.   allopurinol (ZYLOPRIM) 300 MG tablet Take 150 mg by mouth daily.   aspirin EC 81 MG tablet Take 81 mg by mouth daily.   cholecalciferol (VITAMIN D3) 10 MCG (400 UNIT) TABS tablet Take 800 Units by mouth daily.    Melatonin 5 MG  TABS Take 3 mg by mouth at bedtime.    methimazole (TAPAZOLE) 5 MG tablet Take 2.5 mg by mouth daily.   omeprazole (PRILOSEC) 20 MG capsule Take 20 mg by mouth daily.   polyethylene glycol powder (GLYCOLAX/MIRALAX) 17 GM/SCOOP powder Take 1 Container by mouth once.   tamsulosin (FLOMAX) 0.4 MG CAPS capsule Take 0.8 mg by mouth.   traMADol (ULTRAM) 50 MG tablet Take 50 mg by mouth every 8 (eight) hours as needed  for moderate pain.    No facility-administered encounter medications on file as of 03/08/2020.    PHYSICAL EXAM / ROS:   Current and past weights: 138 lb in June, 133 lb now. 6 lb loss, 3% loss. General: NAD, frail appearing, thin, cachectic Cardiovascular: HR Regular, no chest pain reported, no edema  Pulmonary: no cough, no increased SOB, room air Abdomen: appetite 25-50%, Will not eat things he does not like, denies constipation, incontinent of bowel GU: no s/sx dysuria, incontinent of urine MSK:  ++ joint and ROM abnormalities, non ambulatory,oob to w/c Skin: no rashes or wounds reported Neurological: Weakness, staff denies hallucinations, Deaf, uses communication board.   Jason Coop, NP , DNP, MPH, [facility]  COVID-19 PATIENT SCREENING TOOL  Person answering questions: ____________staff______ _____   1.  Is the patient or any family member in the home showing any signs or symptoms regarding respiratory infection?               Person with Symptom- __________NA_________________  a. Fever                                                                          Yes___ No___          ___________________  b. Shortness of breath                                                    Yes___ No___          ___________________ c. Cough/congestion                                       Yes___  No___         ___________________ d. Body aches/pains                                                         Yes___ No___        ____________________ e. Gastrointestinal symptoms (diarrhea, nausea)           Yes___ No___        ____________________  2. Within the past 14 days, has anyone living in the home had any contact with someone with or under investigation for COVID-19?    Yes___ No_X_   Person __________________

## 2020-04-28 ENCOUNTER — Non-Acute Institutional Stay: Payer: Medicare Other | Admitting: Primary Care

## 2020-04-28 ENCOUNTER — Other Ambulatory Visit: Payer: Self-pay

## 2020-04-28 DIAGNOSIS — I69351 Hemiplegia and hemiparesis following cerebral infarction affecting right dominant side: Secondary | ICD-10-CM

## 2020-04-28 DIAGNOSIS — Z515 Encounter for palliative care: Secondary | ICD-10-CM

## 2020-04-28 DIAGNOSIS — I69322 Dysarthria following cerebral infarction: Secondary | ICD-10-CM

## 2020-04-28 DIAGNOSIS — R5383 Other fatigue: Secondary | ICD-10-CM

## 2020-04-28 NOTE — Progress Notes (Signed)
[facility] Consult Note Telephone: [phone]  Fax: [phone]     Date of encounter: 04/28/20 PATIENT NAME: Eugene [name] [id number] [phone] (home)  DOB: Oct 09, 1929 MRN: [id number]  PRIMARY CARE PROVIDER:    Alvester Morin, MD,  [place]. [place] [address] [phone]  REFERRING PROVIDER:   Alvester Morin, MD [address] [place] [place],  [place] [address] [phone]  RESPONSIBLE PARTY:   Extended Emergency Contact Information Primary Emergency Contact: Darnelle Maffucci Address: [address]          [place], [place] [address] Home Phone: [phone] Mobile Phone: [phone] Relation: Daughter Secondary Emergency Contact: Alona Bene Mobile Phone: [phone] Relation: Daughter  I met face to face with patient in facility. Later, I discussed case with POA Judy Cobb. Palliative Care was asked to follow this patient by consultation request of Slade-Hartman, Ivette Loyal* to help address advance care planning and goals of care. This is a follow up  Visit.  ASSESSMENT AND RECOMMENDATIONS:   1. Advance Care Planning/Goals of Care: Goals include to maximize quality of life and symptom management. Patient is DNR and goals of care remain comfort and supportive. I called POA daughter Bethena Roys to discuss his treatment goals and any concerns. Continues to desire hospice admission when eligible and meeting criteria.  2. Symptom Management:    Staff report MR Harr fell last pm. He was found on the floor. They are conducting neuro checks every 2 hours .When I met with him, he was drowsy and did not rouse, despite being shaken. He is deaf so would not respond to auditory stimulus. This finding was reported to nursing staff. Staff and family endorse patient getting more confused, agitated and harder to arouse.   Since our list visit he has decreased in weight. His  current weight is 127 lbs and 2 months ago was 133 lbs. In Feb 2021 weight was 135 lbs and in 2/21 = 140 lbs This is a loss of 13 lbs (10 %) in 9 months. Will continue to assess and follow. In the past several months he was assessed for hospice but not found eligible.  3. Follow up Palliative Care Visit: Palliative care will continue to follow for goals of care clarification and symptom management. Return 4 weeks or prn.  4. Family /Caregiver/Community Supports: Daughter Bethena Roys is POA, lives in [place].  5. Cognitive / Functional decline: Lethargic, not arousable. Dependent in all adls, iadls.  I spent 35 minutes providing this consultation,  from 1000 to 1035. More than 50% of the time in this consultation was spent coordinating communication.   CODE STATUS:DNR  PPS: 30%  HOSPICE ELIGIBILITY/DIAGNOSIS: TBD  Subjective:  CHIEF COMPLAINT: weight loss  HISTORY OF PRESENT ILLNESS:  Eugene [name] is a 86 y.o. year old male  with h/o cva, dementia, deafness. Has had falls and most recently one last pm. Family declines for hospitalizations for simple falls.   We are asked to consult around sx management, hospice eligibility.    History obtained from review of EMR, discussion with primary team, and  interview with family, caregiver  and/or Mr. [name]. Records reviewed and summarized above.   CURRENT PROBLEM LIST:  Patient Active Problem List   Diagnosis Date Noted  . Closed right hip fracture, initial encounter ([place]) 03/19/2016  . Increased frequency of urination 01/07/2016  . Pressure ulcer, stage 1 11/03/2015  . DOE (dyspnea on exertion) 03/31/2015  . Fatigue  03/31/2015  . Spastic hemiparesis affecting dominant side ([place]) 02/25/2015  . Bilateral edema of lower extremity 12/02/2014  . Nocturnal muscle cramps 12/02/2014  . Mixed hyperlipidemia 08/31/2014  . Somnolence 08/31/2014  . Incontinence 05/27/2014  . Renal insufficiency 05/27/2014  . Barrett's esophagus 04/08/2014  . Bilateral  impacted cerumen 04/08/2014  . Neuropathy 04/08/2014  . Hypertension 01/05/2014  . Dysarthria following cerebrovascular accident 12/11/2013  . Hypokalemia with normal acid-base balance 12/11/2013  . Hemiparesis affecting right side as late effect of cerebrovascular accident ([place]) 12/11/2013   PAST MEDICAL HISTORY:  Active Ambulatory Problems    Diagnosis Date Noted  . Barrett's esophagus 04/08/2014  . Bilateral edema of lower extremity 12/02/2014  . Bilateral impacted cerumen 04/08/2014  . Closed right hip fracture, initial encounter ([place]) 03/19/2016  . DOE (dyspnea on exertion) 03/31/2015  . Dysarthria following cerebrovascular accident 12/11/2013  . Fatigue 03/31/2015  . Hypokalemia with normal acid-base balance 12/11/2013  . Hypertension 01/05/2014  . Incontinence 05/27/2014  . Mixed hyperlipidemia 08/31/2014  . Increased frequency of urination 01/07/2016  . Neuropathy 04/08/2014  . Nocturnal muscle cramps 12/02/2014  . Renal insufficiency 05/27/2014  . Hemiparesis affecting right side as late effect of cerebrovascular accident ([place]) 12/11/2013  . Pressure ulcer, stage 1 11/03/2015  . Spastic hemiparesis affecting dominant side ([place]) 02/25/2015  . Somnolence 08/31/2014   Resolved Ambulatory Problems    Diagnosis Date Noted  . No Resolved Ambulatory Problems   Past Medical History:  Diagnosis Date  . Cerebral infarct ([facility])   . Gout    SOCIAL HX:  Social History   Tobacco Use  . Smoking status: Not on file  Substance Use Topics  . Alcohol use: Not on file   FAMILY HX: No family history on file.  ALLERGIES: No Known Allergies   PERTINENT MEDICATIONS:  Outpatient Encounter Medications as of 04/28/2020  Medication Sig  . acetaminophen (TYLENOL) 650 MG CR tablet Take 1,300 mg by mouth every 12 (twelve) hours.  Marland Kitchen allopurinol (ZYLOPRIM) 300 MG tablet Take 150 mg by mouth daily.  . Artificial Tear Solution (SYSTANE CONTACTS) SOLN Apply 1 drop to eye in the morning and  at bedtime.  Marland Kitchen aspirin EC 81 MG tablet Take 81 mg by mouth daily.  . cholecalciferol (VITAMIN D3) 10 MCG (400 UNIT) TABS tablet Take 800 Units by mouth daily.   . Melatonin 3 MG CAPS Take 3 mg by mouth at bedtime.   . methimazole (TAPAZOLE) 5 MG tablet Take 2.5 mg by mouth daily.  Marland Kitchen neomycin-polymyxin b-dexamethasone (MAXITROL) 3.5-10000-0.1 SUSP Place 1 drop into both eyes 2 (two) times daily.  Marland Kitchen omeprazole (PRILOSEC) 20 MG capsule Take 20 mg by mouth daily.  . polyethylene glycol powder (GLYCOLAX/MIRALAX) 17 GM/SCOOP powder Take 1 Container by mouth daily.   . tamsulosin (FLOMAX) 0.4 MG CAPS capsule Take 0.8 mg by mouth.  . traMADol (ULTRAM) 50 MG tablet Take 50 mg by mouth every 8 (eight) hours as needed for moderate pain.   . [DISCONTINUED] acetaminophen (TYLENOL) 325 MG tablet Take 650 mg by mouth every 8 (eight) hours.   No facility-administered encounter medications on file as of 04/28/2020.     Objective: ROS/staff  General: unarousable ENMT: denies dysphagia Cardiovascular: denies chest pain Pulmonary: denies  cough, denies increased SOB Abdomen: endorses fair appetite, endorses constipation, endorses incontinence of bowel GU: denies dysuria, endorses incontinence of urine MSK:  endorses increased ROM limitations, Several  falls reported Skin: denies rashes or wounds Neurological: endorses increased  Weakness, endorses  pain, denies insomnia Psych: Endorses usual flat  mood, A and O x 1- 2 per staff and family. Family endorses agitatio and increasing confusion. Heme/lymph/immuno: denies bruises, abnormal bleeding  Physical Exam: Current and past weights: 127, down from 140 in 2/21; 10 % loss over 10  Mos. Constitutional: Vital signs, NAD General :frail appearing, thin EYES: anicteric sclera,lids intact, no discharge  ENMT: deaf CV:  RRR, slight  LE edema Pulmonary: no increased work of breathing, no cough,  room air Abdomen: intake 25-50%, no ascites GU: deferred MSK:  severe sacropenia, decreased ROM in all extremities, R hemiplegia, ++ contractures of R LE, non ambulatory, moves in w/c Skin: warm and dry, no rashes or wounds on visible skin Neuro: Weakness, severe cognitive impairment Psych:  somnolent. Hem/lymph/immuno/ no widespread bruising   Thank you for the opportunity to participate in the care of Mr. [name].  The palliative care team will continue to follow. Please call our office at [phone] if we can be of additional assistance.  Jason Coop, NP , DNP, MPH, AGPCNP-BC, [facility]  COVID-19 PATIENT SCREENING TOOL  Person answering questions: ____________staff______ _____   1.  Is the patient or any family member in the home showing any signs or symptoms regarding respiratory infection?               Person with Symptom- __________NA_________________  a. Fever                                                                          Yes___ No___          ___________________  b. Shortness of breath                                                    Yes___ No___          ___________________ c. Cough/congestion                                       Yes___  No___         ___________________ d. Body aches/pains                                                         Yes___ No___        ____________________ e. Gastrointestinal symptoms (diarrhea, nausea)           Yes___ No___        ____________________  2. Within the past 14 days, has anyone living in the home had any contact with someone with or under investigation for COVID-19?    Yes___ No_X_   Person __________________

## 2020-06-24 ENCOUNTER — Encounter: Payer: Self-pay | Admitting: Primary Care

## 2020-06-24 ENCOUNTER — Other Ambulatory Visit: Payer: Self-pay

## 2020-06-24 ENCOUNTER — Non-Acute Institutional Stay: Payer: Medicare Other | Admitting: Primary Care

## 2020-06-24 DIAGNOSIS — Z515 Encounter for palliative care: Secondary | ICD-10-CM

## 2020-06-24 DIAGNOSIS — I69351 Hemiplegia and hemiparesis following cerebral infarction affecting right dominant side: Secondary | ICD-10-CM

## 2020-06-24 DIAGNOSIS — I69322 Dysarthria following cerebral infarction: Secondary | ICD-10-CM

## 2020-06-24 NOTE — Progress Notes (Signed)
[facility] Palliative Care Consult Note Telephone: [phone]  Fax: [phone]     Date of encounter: 06/24/20 PATIENT NAME: Eugene [name] Eugene [name] [id number] [phone] (home)  DOB: 07-May-1930 MRN: [id number]  PRIMARY CARE PROVIDER:    Alvester Morin, MD,  [place] [place] [address] [phone]  REFERRING PROVIDER:   Alvester Morin, MD [address]. [place],  [place] [address] [phone]  RESPONSIBLE PARTY:   Extended Emergency Contact Information Primary Emergency Contact: Darnelle Maffucci Address: [address]          [place], [place] [address] Home Phone: [phone] Mobile Phone: [phone] Relation: Daughter Secondary Emergency Contact: Alona Bene Mobile Phone: [phone] Relation: Daughter  I met face to face with patient in facility. Palliative Care was asked to follow this patient by consultation request of Slade-Hartman, Ivette Loyal* to help address advance care planning and goals of care. This is a follow up  visit.   ASSESSMENT AND RECOMMENDATIONS:   1. Advance Care Planning/Goals of Care: Goals include to maximize quality of life and symptom management. DNR on record  2. Symptom Management:  I saw Mr. Cleland in his nursing home room. He is sitting up after breakfast as his habit. He is a little sleepy but is able to rouse and answer that he is fine today. Due to his total hearing loss it's difficult to converse. He appears not to be able to read the whiteboard as fluidly as four  to six months ago. Staff reports that he sometimes has some right foot discomfort but overall he is sitting up some during the day and they deny skin breakdown  Patient continues to need assistance with meals. Now needs total feed. Daughter states he is hungry and he states he is not getting enough food. Weight loss 7% in 2 months, from 127 lbs to 119 lbs. Give Mighty Shakes and  Magic cup ID  Please. PLEASE FEED PATIENT. MUST BE FED.  Constipation: Noted in record seldom has bm recorded. Please initiate a half dose of miralax daily  Reddened eyes: Appear worse, please apply artificial tears tid ATC. Please reinitiate eye lid cleansing for blepharitis.   Daughter states concern with increased covid infections. Patient is not exhibiting symptoms presently She denies he has pain and seems comfortable most of the time now.   3. Follow up Palliative Care Visit: Palliative care will continue to follow for goals of care clarification and symptom management. Return 8 weeks or prn.  4. Family /Caregiver/Community Supports: Daughter is Designer, industrial/product, lives in [place].  5. Cognitive / Functional decline: A and O x 1,   I spent325 minutes providing this consultation,  from 1000 to 1035. More than 50% of the time in this consultation was spent in counseling and care coordination.  CODE STATUS: DNR  PPS: 30%  HOSPICE ELIGIBILITY/DIAGNOSIS: TBD  Subjective:  CHIEF COMPLAINT: weight loss  HISTORY OF PRESENT ILLNESS:  Eugene [name] is a 87 y.o. year old male  with Weight loss, old CVA, contractures. He is unable to ambulate and needs assistance changing positions . He has needs to be fed each meal (total feed) as he voices hunger but has lost weight. He eats voraciously when fed by daughter.  Also exhibits constipation per record, after cessation of his daily laxative.  We are asked to consult around advance care planning and complex medical decision making.   History obtained from review of EMR, discussion with primary  team, and  interview with family, caregiver  and/or Mr. Eugene [name]. Records reviewed and summarized above.   CURRENT PROBLEM LIST:  Patient Active Problem List   Diagnosis Date Noted  . Closed right hip fracture, initial encounter ([facility]) 03/19/2016  . Increased frequency of urination 01/07/2016  . Pressure ulcer, stage 1 11/03/2015  . DOE (dyspnea on exertion) 03/31/2015   . Fatigue 03/31/2015  . Spastic hemiparesis affecting dominant side ([facility]) 02/25/2015  . Bilateral edema of lower extremity 12/02/2014  . Nocturnal muscle cramps 12/02/2014  . Mixed hyperlipidemia 08/31/2014  . Somnolence 08/31/2014  . Incontinence 05/27/2014  . Renal insufficiency 05/27/2014  . Barrett's esophagus 04/08/2014  . Bilateral impacted cerumen 04/08/2014  . Neuropathy 04/08/2014  . Hypertension 01/05/2014  . Dysarthria following cerebrovascular accident 12/11/2013  . Hypokalemia with normal acid-base balance 12/11/2013  . Hemiparesis affecting right side as late effect of cerebrovascular accident ([facility]) 12/11/2013   PAST MEDICAL HISTORY:  Active Ambulatory Problems    Diagnosis Date Noted  . Barrett's esophagus 04/08/2014  . Bilateral edema of lower extremity 12/02/2014  . Bilateral impacted cerumen 04/08/2014  . Closed right hip fracture, initial encounter ([facility]) 03/19/2016  . DOE (dyspnea on exertion) 03/31/2015  . Dysarthria following cerebrovascular accident 12/11/2013  . Fatigue 03/31/2015  . Hypokalemia with normal acid-base balance 12/11/2013  . Hypertension 01/05/2014  . Incontinence 05/27/2014  . Mixed hyperlipidemia 08/31/2014  . Increased frequency of urination 01/07/2016  . Neuropathy 04/08/2014  . Nocturnal muscle cramps 12/02/2014  . Renal insufficiency 05/27/2014  . Hemiparesis affecting right side as late effect of cerebrovascular accident ([facility]) 12/11/2013  . Pressure ulcer, stage 1 11/03/2015  . Spastic hemiparesis affecting dominant side ([facility] [place]) 02/25/2015  . Somnolence 08/31/2014   Resolved Ambulatory Problems    Diagnosis Date Noted  . No Resolved Ambulatory Problems   Past Medical History:  Diagnosis Date  . Cerebral infarct ([facility])   . Gout    SOCIAL HX:  Social History   Tobacco Use  . Smoking status: Not on file  . Smokeless tobacco: Not on file  Substance Use Topics  . Alcohol use: Not on file   FAMILY HX:  Family History   Problem Relation Age of Onset  . Breast cancer Other     ALLERGIES: No Known Allergies    PERTINENT MEDICATIONS:  Outpatient Encounter Medications as of 06/24/2020  Medication Sig  . acetaminophen (TYLENOL) 650 MG CR tablet Take 1,300 mg by mouth every 12 (twelve) hours.  Marland Kitchen allopurinol (ZYLOPRIM) 300 MG tablet Take 150 mg by mouth daily.  . Artificial Tear Solution (SYSTANE CONTACTS) SOLN Apply 1 drop to eye in the morning and at bedtime.  Marland Kitchen aspirin EC 81 MG tablet Take 81 mg by mouth daily.  . cholecalciferol (VITAMIN D3) 10 MCG (400 UNIT) TABS tablet Take 800 Units by mouth daily.   . Melatonin 3 MG CAPS Take 3 mg by mouth at bedtime.   . methimazole (TAPAZOLE) 5 MG tablet Take 2.5 mg by mouth daily.  Marland Kitchen omeprazole (PRILOSEC) 20 MG capsule Take 20 mg by mouth daily.  . tamsulosin (FLOMAX) 0.4 MG CAPS capsule Take 0.8 mg by mouth.  . traMADol (ULTRAM) 50 MG tablet Take 50 mg by mouth every 8 (eight) hours as needed for moderate pain.  (Patient not taking: Reported on 06/24/2020)  . [DISCONTINUED] neomycin-polymyxin b-dexamethasone (MAXITROL) 3.5-10000-0.1 SUSP Place 1 drop into both eyes 2 (two) times daily.  . [DISCONTINUED] polyethylene glycol powder (GLYCOLAX/MIRALAX) 17 GM/SCOOP powder Take 1 Container  by mouth daily.    No facility-administered encounter medications on file as of 06/24/2020.    Objective: ROS/staff/family   General: NAD EYES: endorses vision changes ENMT: endorses dysphagia Cardiovascular: denies chest pain Pulmonary: denies  cough, denies increased SOB Abdomen: endorses good appetite, endorses constipation, endorses incontinence of bowel GU: denies dysuria, endorses incontinence of urine MSK:  endorses ROM limitations, no falls reported Neurological: endorses weakness, endorses occ  pain, denies insomnia Psych: Endorses flat mood Heme/lymph/immuno: denies bruises, abnormal bleeding  Physical Exam: Current and past weights: 119.2 currently, 127.4 in  11/21. Constitutional: NAD General: frail appearing, thin EYES: anicteric sclera,lids intact, no discharge  ENMT: total  loss of hearing,oral mucous membranes moist CV: no LE edema Pulmonary:  no increased work of breathing, no cough, no audible wheezes, room air Abdomen: intake better with assistance,  no ascites GU: deferred MSK: severe sarcopenia, decreased ROM in all extremities, some R foot contracture, non ambulatory Skin: warm and dry, no rashes or wounds on visible skin Neuro: Generalized weakness, + cognitive impairment Psych: non-anxious affect, A and O x 1-2 Hem/lymph/immuno: no widespread bruising   Thank you for the opportunity to participate in the care of Mr. Eugene [name].  The palliative care team will continue to follow. Please call our office at [phone] if we can be of additional assistance.  Jason Coop, NP , DNP, MPH, AGPCNP-BC, ACHPN  COVID-19 PATIENT SCREENING TOOL  Person answering questions: ____________staff______ _____   1.  Is the patient or any family member in the home showing any signs or symptoms regarding respiratory infection?               Person with Symptom- __________NA_________________  a. Fever                                                                          Yes___ No___          ___________________  b. Shortness of breath                                                    Yes___ No___          ___________________ c. Cough/congestion                                       Yes___  No___         ___________________ d. Body aches/pains                                                         Yes___ No___        ____________________ e. Gastrointestinal symptoms (diarrhea, nausea)           Yes___ No___        ____________________  2. Within the past 14 days, has anyone  living in the home had any contact with someone with or under investigation for COVID-19?    Yes___ No_X_   Person __________________

## 2020-08-19 ENCOUNTER — Non-Acute Institutional Stay: Payer: Medicare Other | Admitting: Primary Care

## 2020-08-19 ENCOUNTER — Other Ambulatory Visit: Payer: Self-pay

## 2020-08-19 DIAGNOSIS — Z515 Encounter for palliative care: Secondary | ICD-10-CM

## 2020-08-19 DIAGNOSIS — G8110 Spastic hemiplegia affecting unspecified side: Secondary | ICD-10-CM

## 2020-08-19 DIAGNOSIS — I69322 Dysarthria following cerebral infarction: Secondary | ICD-10-CM

## 2020-08-19 DIAGNOSIS — I69351 Hemiplegia and hemiparesis following cerebral infarction affecting right dominant side: Secondary | ICD-10-CM

## 2020-08-19 NOTE — Progress Notes (Signed)
[facility] Palliative Care Consult Note Telephone: [phone]  Fax: [phone]    Date of encounter: 08/19/20 PATIENT NAME: Eugene [name] [place] [address] [phone] (home)  DOB: 26-Feb-1930 MRN: [id number]  PRIMARY CARE PROVIDER:    Alvester Morin, MD,  [place]. [place] [address] [phone]  REFERRING PROVIDER:   Alvester Morin, MD [address] [place],  [place] [address] [phone]  RESPONSIBLE PARTY:   Extended Emergency Contact Information Primary Emergency Contact: Darnelle Maffucci Address: [address]          [place], [place] [address] Home Phone: [phone] Mobile Phone: [phone] Relation: Daughter Secondary Emergency Contact: Alona Bene Mobile Phone: [phone] Relation: Daughter  I met face to face with patient in facility. Palliative Care was asked to follow this patient by consultation request of Slade-Hartman, Ivette Loyal* to help address advance care planning and goals of care. This is a follow up  visit.  ASSESSMENT AND RECOMMENDATIONS:   1. Advance Care Planning/Goals of Care: Goals include to maximize quality of life and symptom management. Our advance care planning conversation included a discussion about:     The value and importance of advance care planning   Experiences with loved ones who have been seriously ill or have died   Exploration of personal, cultural or spiritual beliefs that might influence medical decisions   Exploration of goals of care in the event of a sudden injury or illness   Identification and preparation of a healthcare agent   Review of an advance directive document. DNR on file  Decision to de-escalate disease focused treatments due to poor prognosis and enroll in hospice.   Family has noted his decline and weakness, and weight loss even though he is eating. We discussed his pain and fatigue and family desire  to keep him comfortable. He endorses being hungry and eating, but losing weight. We discussed geriatric syndrome of weight loss and malnutrition.  I spent 20 minutes providing this consultation,  from 1500 to 1520. More than 50% of the time in this consultation was spent in counseling and care coordination.  ----------------------------------------------------------------------------------------------------------------------  2. Symptom Management:   Frailty/Decline: Patient with increased periods of sleeping per staff, over past several weeks. He is also eating less and in fact has had a 15 lb (11%) weight loss in the last 3 months. I would recommend hospice referral at this time due to decline and need for family support though next phase of patient's journey. Was covid + in 1/22 per testing, symptoms did not present.  Immobility: Staff endorses he was up in w/c and able to move himself around in the building as recently as 2-3 weeks ago, and  Now family states he begs to get in bed due to discomfort of being up in chair. Recommend to allow patient to be in bed for comfort measures.  Sensory deprivation, hearing: Ear Drops: Family would like to finish  ear wax removal and begin  use of hearing aids again due to sensory deprivation. Recommend NP clean out ears and stop debrox drops after that so he can use his hearing aids.  Intake: Patient needs to be total feed at this time due to fatigue. He has eaten some meals well and others not. Recommend liberal diet. He has also has refused medications so recommend de prescribing.  3. Follow up Palliative Care Visit: Recommend referral to hospice. Return 2-4 weeks or prn if not admitted to hospice  4. Family /Caregiver/Community Supports: daughter is POA, lives in LTC.  5. Cognitive / Functional decline: A and O x 1, lethargic, fully dependent in all adls.  CODE STATUS: DNR  PPS: 30%  HOSPICE ELIGIBILITY/DIAGNOSIS: TBD  Subjective:  CHIEF  COMPLAINT: Debility  HISTORY OF PRESENT ILLNESS:  Eugene [name] is a 87 y.o. year old male  with h/o CVA with r hemiparesis, total hearing loss, abnormal weight loss. He presents with increased wt loss, increased times of sleeping and increased painfulness on being up in w/c. Family has stated comfort is their goal and requests he be admitted to hospice services for comfort management.   We are asked to consult around advance care planning and complex medical decision making.    Review and summarization of old Epic records shows or history from other than patient.  Review or lab tests, radiology,  or medicine.  Covid + in 1/22. Review of case with family member. Daughter Sherrie Mustache  History obtained from review of EMR, discussion with primary team, and  interview with family, caregiver  and/or Eugene [name]. Records reviewed and summarized above.   CURRENT PROBLEM LIST:  Patient Active Problem List   Diagnosis Date Noted  . Closed right hip fracture, initial encounter ([facility]) 03/19/2016  . Increased frequency of urination 01/07/2016  . Pressure ulcer, stage 1 11/03/2015  . DOE (dyspnea on exertion) 03/31/2015  . Fatigue 03/31/2015  . Spastic hemiparesis affecting dominant side ([facility]) 02/25/2015  . Bilateral edema of lower extremity 12/02/2014  . Nocturnal muscle cramps 12/02/2014  . Mixed hyperlipidemia 08/31/2014  . Somnolence 08/31/2014  . Incontinence 05/27/2014  . Renal insufficiency 05/27/2014  . Barrett's esophagus 04/08/2014  . Bilateral impacted cerumen 04/08/2014  . Neuropathy 04/08/2014  . Hypertension 01/05/2014  . Dysarthria following cerebrovascular accident 12/11/2013  . Hypokalemia with normal acid-base balance 12/11/2013  . Hemiparesis affecting right side as late effect of cerebrovascular accident ([facility]) 12/11/2013   PAST MEDICAL HISTORY:  Active Ambulatory Problems    Diagnosis Date Noted  . Barrett's esophagus 04/08/2014  . Bilateral edema of lower extremity  12/02/2014  . Bilateral impacted cerumen 04/08/2014  . Closed right hip fracture, initial encounter ([facility]) 03/19/2016  . DOE (dyspnea on exertion) 03/31/2015  . Dysarthria following cerebrovascular accident 12/11/2013  . Fatigue 03/31/2015  . Hypokalemia with normal acid-base balance 12/11/2013  . Hypertension 01/05/2014  . Incontinence 05/27/2014  . Mixed hyperlipidemia 08/31/2014  . Increased frequency of urination 01/07/2016  . Neuropathy 04/08/2014  . Nocturnal muscle cramps 12/02/2014  . Renal insufficiency 05/27/2014  . Hemiparesis affecting right side as late effect of cerebrovascular accident ([facility]) 12/11/2013  . Pressure ulcer, stage 1 11/03/2015  . Spastic hemiparesis affecting dominant side ([facility]) 02/25/2015  . Somnolence 08/31/2014   Resolved Ambulatory Problems    Diagnosis Date Noted  . No Resolved Ambulatory Problems   Past Medical History:  Diagnosis Date  . Cerebral infarct ([facility])   . Gout    SOCIAL HX:  Social History   Tobacco Use  . Smoking status: Not on file  . Smokeless tobacco: Not on file  Substance Use Topics  . Alcohol use: Not on file   FAMILY HX:  Family History  Problem Relation Age of Onset  . Breast cancer Other       ALLERGIES: No Known Allergies   PERTINENT MEDICATIONS:  Outpatient Encounter Medications as of 08/19/2020  Medication Sig  . acetaminophen (TYLENOL) 650 MG CR tablet Take 1,300 mg by mouth every 12 (twelve)  hours.  . allopurinol (ZYLOPRIM) 300 MG tablet Take 150 mg by mouth daily.  . Artificial Tear Solution (SYSTANE CONTACTS) SOLN Apply 1 drop to eye in the morning and at bedtime.  Marland Kitchen aspirin EC 81 MG tablet Take 81 mg by mouth daily.  . cholecalciferol (VITAMIN D3) 10 MCG (400 UNIT) TABS tablet Take 800 Units by mouth daily.   . Melatonin 3 MG CAPS Take 3 mg by mouth at bedtime.   . methimazole (TAPAZOLE) 5 MG tablet Take 2.5 mg by mouth daily.  Marland Kitchen omeprazole (PRILOSEC) 20 MG capsule Take 20 mg by mouth daily.  .  tamsulosin (FLOMAX) 0.4 MG CAPS capsule Take 0.8 mg by mouth.  . traMADol (ULTRAM) 50 MG tablet Take 50 mg by mouth every 8 (eight) hours as needed for moderate pain.  (Patient not taking: Reported on 06/24/2020)   No facility-administered encounter medications on file as of 08/19/2020.    Objective: ROS/staff report  General: NAD ENMT: denies dysphagia Cardiovascular: denies chest pain Pulmonary: denies  cough, denies increased SOB Abdomen: endorses fair appetite,  endorses incontinence of bowel GU: denies dysuria, endorses incontinence of urine MSK:  endorses ROM limitations, no falls reported Skin: denies rashes or wounds Neurological: endorses increased sleeping and  weakness, endorses frequent pain, denies insomnia, R hemiparesis Psych: Endorses flat mood Heme/lymph/immuno: denies bruises, abnormal bleeding  Physical Exam: Current and past weights: current 122 lbs, has lost 15 lbs in 3 months, was 137 lbs in 11/21. 11 % loss Constitutional:  NAD General: frail appearing, thin EYES: anicteric sclera, lids intact, no discharge  ENMT: total loss of hearing Pulmonary: LCTA, no increased work of breathing, no cough, no audible wheezes, room air Abdomen: intake 50%,no ascites GU: deferred MSK: severe  sarcopenia, decreased ROM in all extremities, no contractures of LE, non ambulatory, R hemiparesis Neuro: Generalized weakness, advanced cognitive impairment Psych: non-anxious affect Thank you for the opportunity to participate in the care of Mr. [name].  The palliative care team will continue to follow. Please call our office at [phone] if we can be of additional assistance.  Jason Coop, NP , DNP, MPH, AGPCNP-BC, ACHPN   COVID-19 PATIENT SCREENING TOOL  Person answering questions: _______staff____________   1.  Is the patient or any family member in the home showing any signs or symptoms regarding respiratory infection?                  Person with Symptom   ______________na___________ a. Fever/chills/headache                                                        Yes___ No__X_            b. Shortness of breath                                                            Yes___ No__X_           c. Cough/congestion  Yes___  No__X_          d. Muscle/Body aches/pains                                                   Yes___ No__X_         e. Gastrointestinal symptoms (diarrhea,nausea)             Yes___ No__X_         f. Sudden loss of smell or taste      Yes___ No__X_        2. Within the past 10 days, has anyone living in the home had any contact with someone with or under investigation for COVID-19?    Yes___ No__X__   Person __________________

## 2020-08-20 ENCOUNTER — Non-Acute Institutional Stay: Payer: Medicare Other | Admitting: Primary Care

## 2021-04-12 DEATH — deceased

## 2021-08-23 IMAGING — DX DG CHEST 1V
1 series · 1 of 1 positions shown · non-contrast
Comparison: None.

CLINICAL DATA: Eye drainage and possible dehydration.

EXAM:
CHEST  1 VIEW

[chest ap]
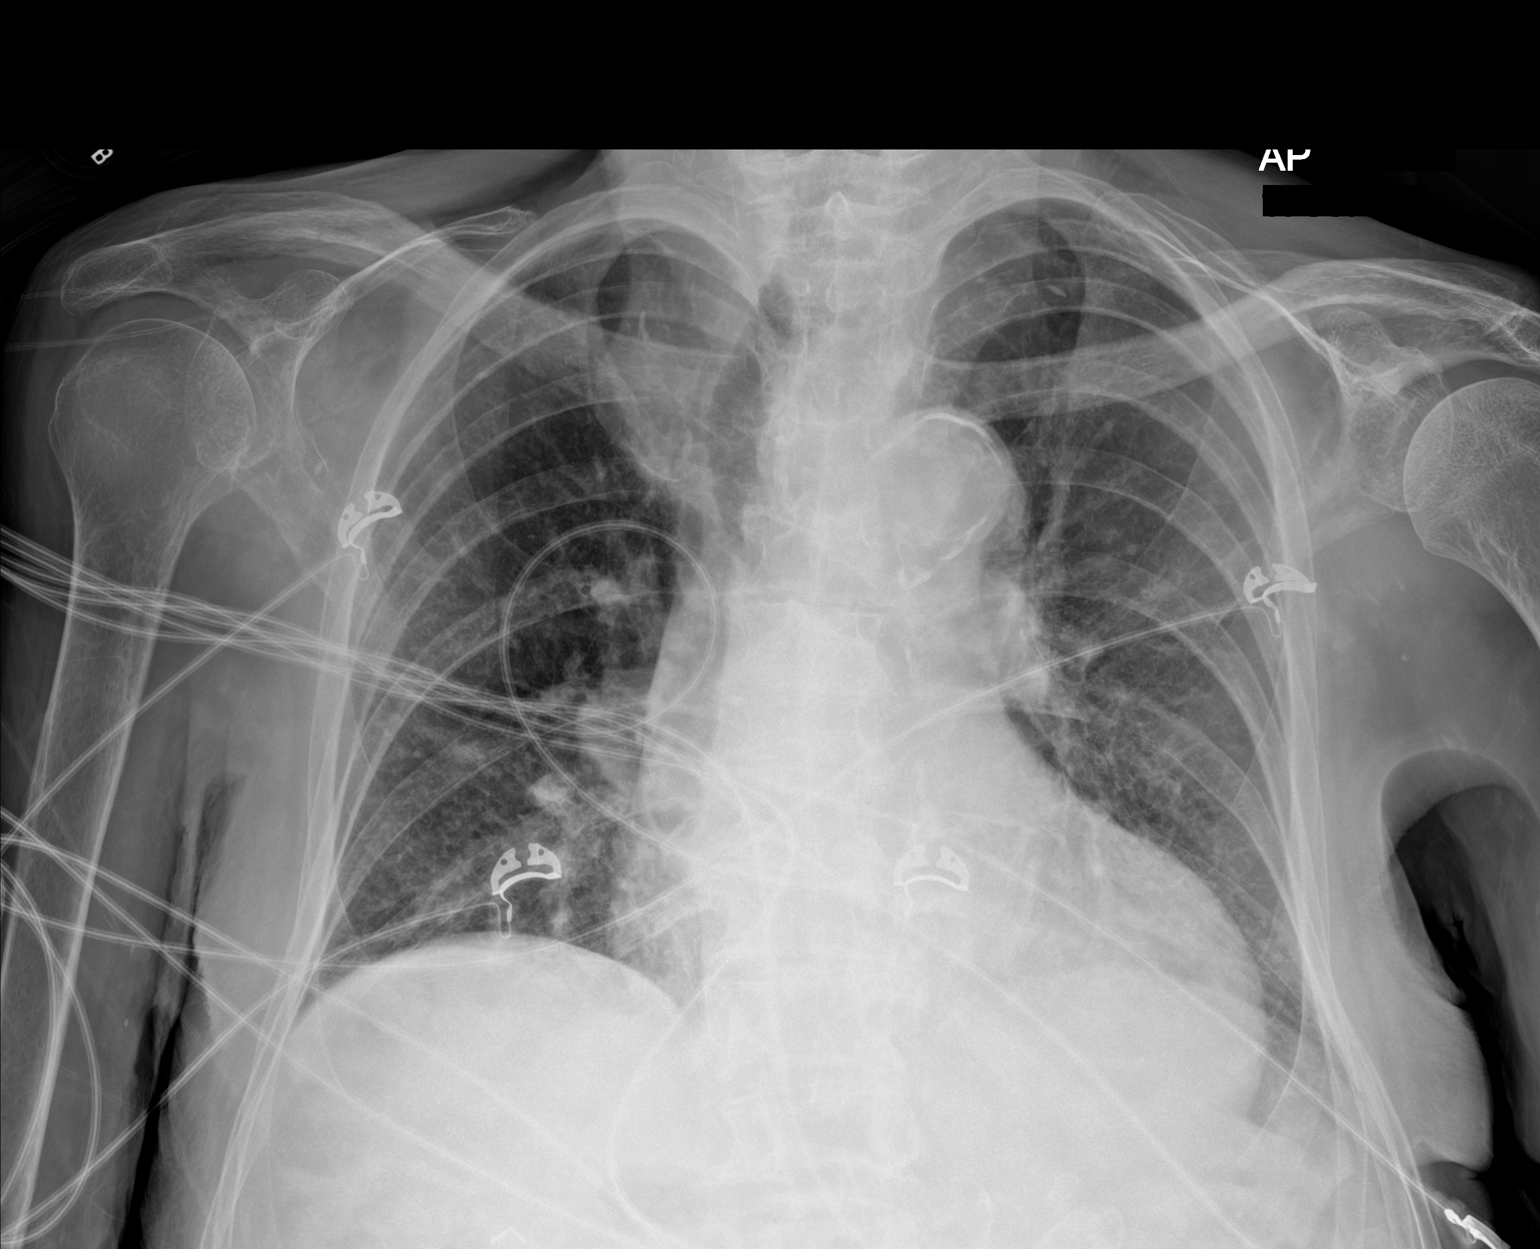

[1 of 1 positions shown; findings below may reference images not displayed]

FINDINGS: 3633 hours. The cardio pericardial silhouette is enlarged. Low
volumes without focal consolidation or pulmonary edema. Interstitial
markings are diffusely coarsened with chronic features. 8 mm nodular
density in the right parahilar lower lung superimposed on the
posterior right eighth rib was not definitely seen on the prior
study. Visualized bony structures of the thorax are intact.
Telemetry leads overlie the chest.
IMPRESSION: Low volume film with cardiomegaly and chronic interstitial
coarsening.

8 mm nodular density in the right parahilar lung, not definitely
seen previously. CT chest without contrast could be used to further
evaluate as clinically warranted.

## 2021-08-23 IMAGING — CT CT HEAD W/O CM
3 series · 15 of 47 positions shown, 18 images · non-contrast
Comparison: 07/08/2019.

CLINICAL DATA: 89-year-old who is nonverbal.

EXAM:
CT HEAD WITHOUT CONTRAST
TECHNIQUE: Contiguous axial images were obtained from the base of the skull
through the vertex without intravenous contrast.

[Series 2: head wo · axial · 0.47mm/px · z∈[-142,-17]mm · 9 of 30 slices shown, 12 images]
[im 3/30  brain]
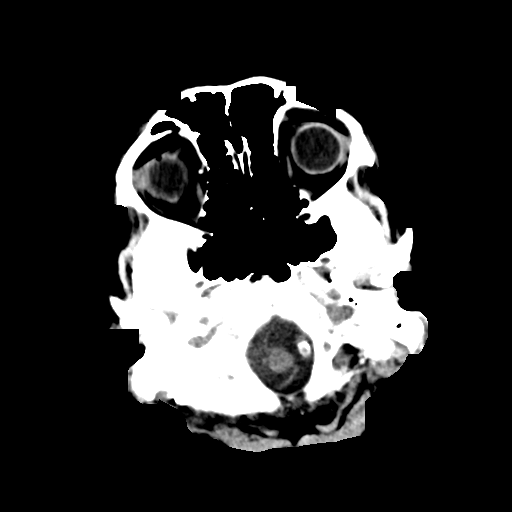
[im 3/30  bone]
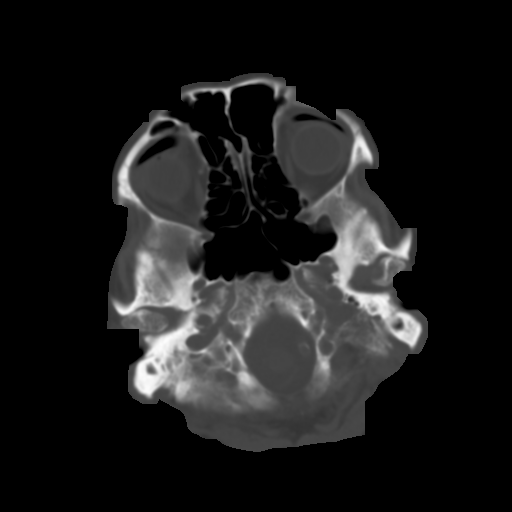
[im 6/30  brain]
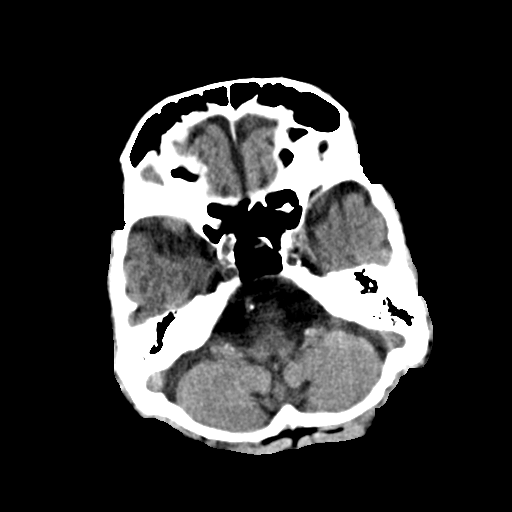
[im 9/30  brain]
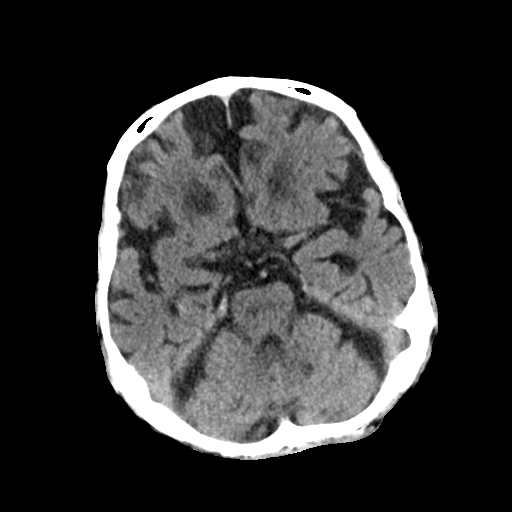
[im 12/30  brain]
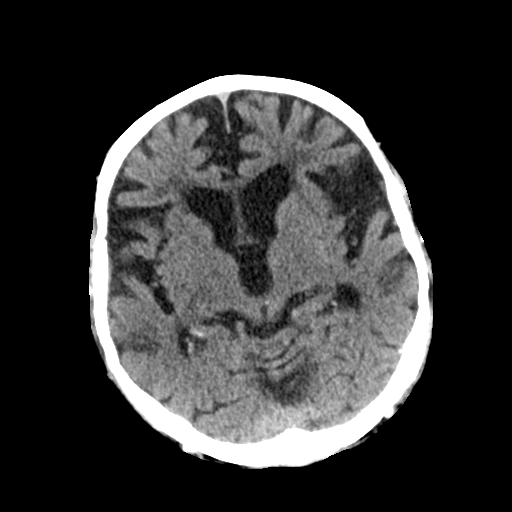
[im 16/30  brain]
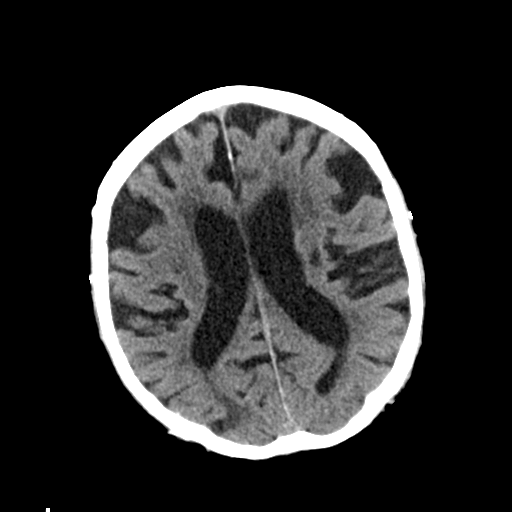
[im 16/30  bone]
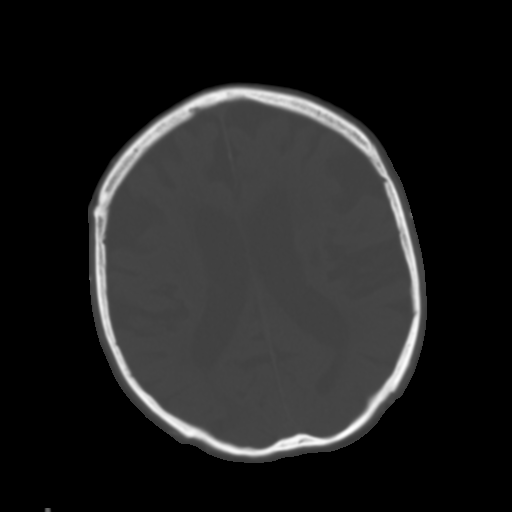
[im 19/30  brain]
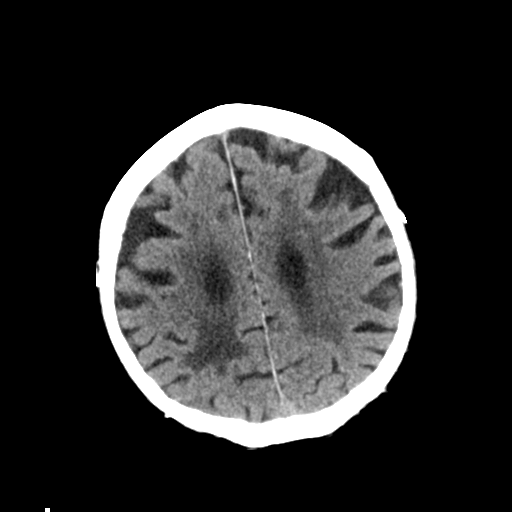
[im 22/30  brain]
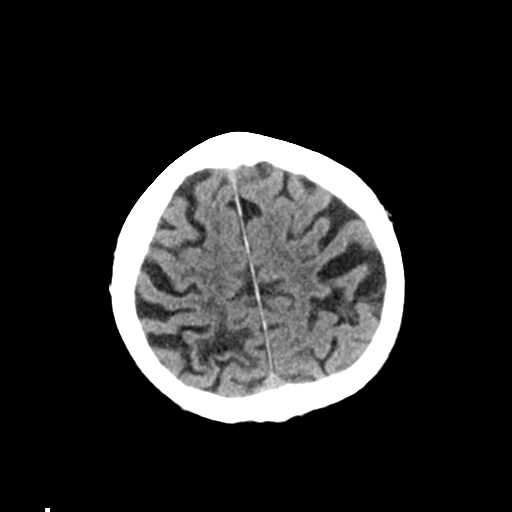
[im 25/30  brain]
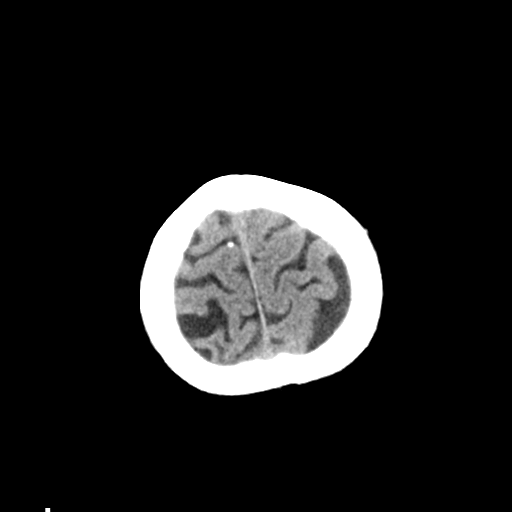
[im 28/30  brain]
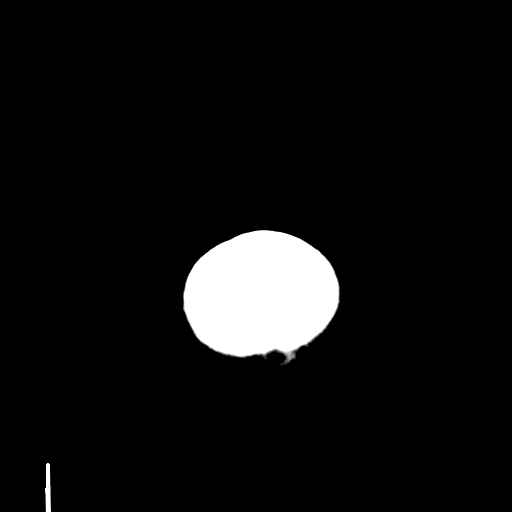
[im 28/30  bone]
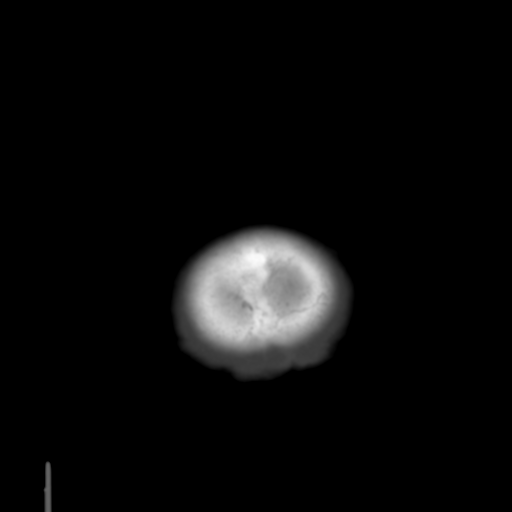

[Series 4: coronal soft tissue · coronal · 0.31mm/px · 3 of 66 slices shown]
[im 22/66  brain]
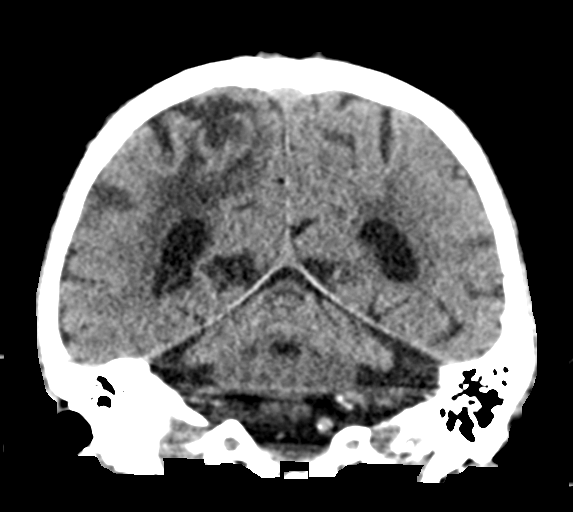
[im 29/66  brain]
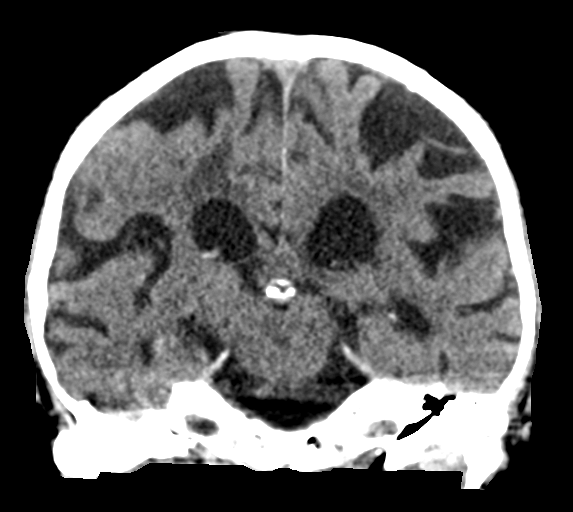
[im 37/66  brain]
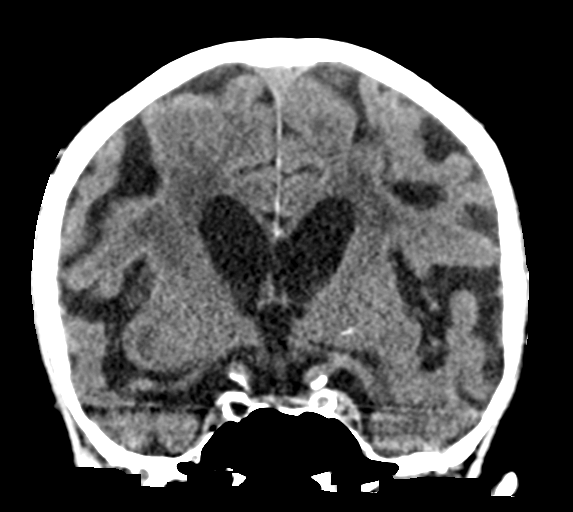

[Series 5: sagittal soft tissue · sagittal · 0.31mm/px · 3 of 59 slices shown]
[im 20/59  brain]
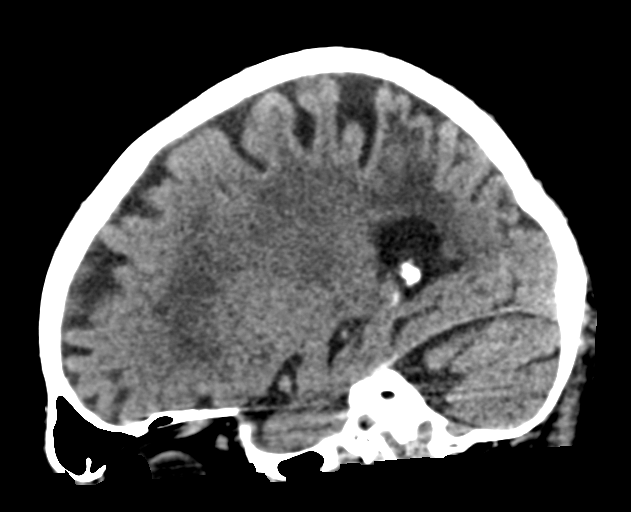
[im 30/59  brain]
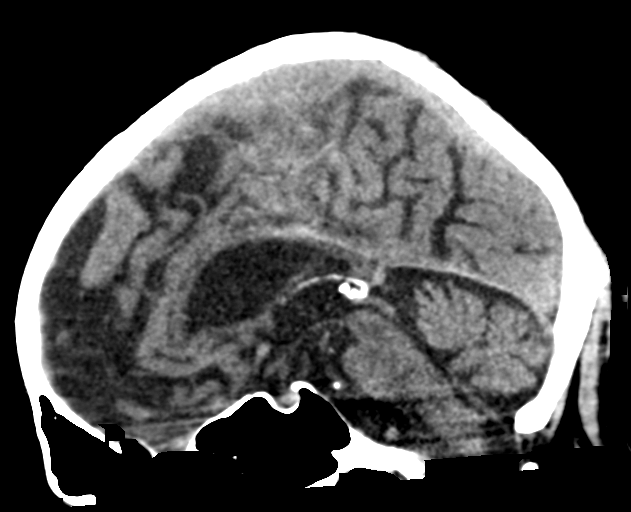
[im 39/59  brain]
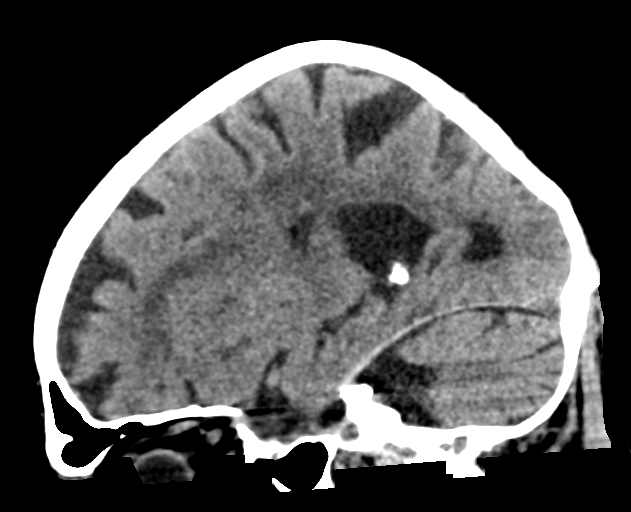

[15 of 47 positions shown; findings below may reference images not displayed]

FINDINGS: Brain: Severe age related cortical and deep atrophy. Severe changes
of small vessel disease of the white matter, focally in the RIGHT
posterior parietal lobe approaching the vertex. Remote lacunar
stroke involving the LEFT superior cerebellar hemisphere. No mass
lesion. No midline shift. No acute hemorrhage or hematoma. No
extra-axial fluid collections. No evidence of acute infarction.

Vascular: Severe BILATERAL carotid siphon and LEFT vertebral artery
atherosclerosis. No hyperdense vessel.

Skull: No skull fracture or other focal osseous abnormality
involving the skull.

Sinuses/Orbits: Visualized paranasal sinuses, bilateral mastoid air
cells and bilateral middle ear cavities well-aerated. Visualized
orbits and globes normal in appearance.

Other: None.
IMPRESSION: 1. No acute intracranial abnormality.
2. Severe age related generalized atrophy and severe chronic
microvascular ischemic changes of the white matter, more focally in
the RIGHT POSTERIOR parietal lobe at the vertex.
3. Remote lacunar stroke involving the LEFT superior cerebellar
hemisphere.
